# Patient Record
Sex: Female | Born: 1995
Health system: Southern US, Community
[De-identification: ages and names within clinical notes are randomized; demographics above are authoritative.]

## PROBLEM LIST (undated history)

## (undated) DIAGNOSIS — G709 Myoneural disorder, unspecified: Secondary | ICD-10-CM

## (undated) DIAGNOSIS — E669 Obesity, unspecified: Secondary | ICD-10-CM

## (undated) DIAGNOSIS — R7303 Prediabetes: Secondary | ICD-10-CM

## (undated) DIAGNOSIS — M6282 Rhabdomyolysis: Secondary | ICD-10-CM

## (undated) DIAGNOSIS — T7840XA Allergy, unspecified, initial encounter: Secondary | ICD-10-CM

## (undated) DIAGNOSIS — L309 Dermatitis, unspecified: Secondary | ICD-10-CM

## (undated) DIAGNOSIS — E282 Polycystic ovarian syndrome: Secondary | ICD-10-CM

## (undated) HISTORY — DX: Myoneural disorder, unspecified: G70.9

## (undated) HISTORY — PX: TONSILLECTOMY: SUR1361

## (undated) HISTORY — DX: Dermatitis, unspecified: L30.9

---
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
## 2017-01-07 MED FILL — NORETHINDRONE 0.35 MG TAB: 0.35 | 84 days supply | Qty: 84 | Fill #2

## 2017-01-08 MED FILL — metroNIDAZOLE 500 MG TABS: 500 | 7 days supply | Qty: 14 | Fill #0

## 2017-01-15 DIAGNOSIS — N7689 Other specified inflammation of vagina and vulva: Secondary | ICD-10-CM | POA: Diagnosis not present

## 2017-01-15 MED FILL — FLUCONAZOLE 150 MG TABLET: 150 | 3 days supply | Qty: 2 | Fill #0

## 2017-02-16 DIAGNOSIS — N771 Vaginitis, vulvitis and vulvovaginitis in diseases classified elsewhere: Secondary | ICD-10-CM | POA: Diagnosis not present

## 2017-02-16 DIAGNOSIS — J301 Allergic rhinitis due to pollen: Secondary | ICD-10-CM | POA: Diagnosis not present

## 2017-02-16 MED FILL — OFLOXACIN 0.3% EAR DROPS: 0.3 | 12 days supply | Qty: 5 | Fill #0

## 2017-03-08 ENCOUNTER — Other Ambulatory Visit (HOSPITAL_COMMUNITY)
Admission: RE | Admit: 2017-03-08 | Discharge: 2017-03-08 | Disposition: A | Discharge status: Home / Self Care | Payer: 59 | Source: Ambulatory Visit | Attending: Family Medicine | Admitting: Family Medicine

## 2017-03-08 ENCOUNTER — Other Ambulatory Visit: Payer: Self-pay | Admitting: Family Medicine

## 2017-03-08 DIAGNOSIS — N760 Acute vaginitis: Secondary | ICD-10-CM | POA: Insufficient documentation

## 2017-03-08 DIAGNOSIS — Z113 Encounter for screening for infections with a predominantly sexual mode of transmission: Secondary | ICD-10-CM | POA: Diagnosis not present

## 2017-03-08 DIAGNOSIS — N771 Vaginitis, vulvitis and vulvovaginitis in diseases classified elsewhere: Secondary | ICD-10-CM | POA: Diagnosis not present

## 2017-03-11 LAB — URINE CYTOLOGY ANCILLARY ONLY
Bacterial vaginitis: NEGATIVE
CHLAMYDIA, DNA PROBE: NEGATIVE
Candida vaginitis: NEGATIVE
Neisseria Gonorrhea: NEGATIVE
Trichomonas: NEGATIVE

## 2017-03-21 ENCOUNTER — Encounter (HOSPITAL_COMMUNITY): Payer: Self-pay | Admitting: *Deleted

## 2017-03-21 ENCOUNTER — Other Ambulatory Visit: Payer: Self-pay

## 2017-03-21 ENCOUNTER — Observation Stay (HOSPITAL_COMMUNITY)
Admission: EM | Admit: 2017-03-21 | Discharge: 2017-03-22 | Disposition: A | Discharge status: Home / Self Care | Payer: 59 | Attending: Internal Medicine | Admitting: Internal Medicine

## 2017-03-21 DIAGNOSIS — Z8249 Family history of ischemic heart disease and other diseases of the circulatory system: Secondary | ICD-10-CM | POA: Diagnosis not present

## 2017-03-21 DIAGNOSIS — E282 Polycystic ovarian syndrome: Secondary | ICD-10-CM | POA: Diagnosis not present

## 2017-03-21 DIAGNOSIS — Z833 Family history of diabetes mellitus: Secondary | ICD-10-CM | POA: Diagnosis not present

## 2017-03-21 DIAGNOSIS — Z9109 Other allergy status, other than to drugs and biological substances: Secondary | ICD-10-CM | POA: Diagnosis not present

## 2017-03-21 DIAGNOSIS — Z68.41 Body mass index (BMI) pediatric, greater than or equal to 95th percentile for age: Secondary | ICD-10-CM | POA: Diagnosis not present

## 2017-03-21 DIAGNOSIS — Z818 Family history of other mental and behavioral disorders: Secondary | ICD-10-CM | POA: Insufficient documentation

## 2017-03-21 DIAGNOSIS — R200 Anesthesia of skin: Secondary | ICD-10-CM | POA: Diagnosis not present

## 2017-03-21 DIAGNOSIS — R748 Abnormal levels of other serum enzymes: Secondary | ICD-10-CM | POA: Insufficient documentation

## 2017-03-21 DIAGNOSIS — E876 Hypokalemia: Secondary | ICD-10-CM | POA: Insufficient documentation

## 2017-03-21 DIAGNOSIS — M6282 Rhabdomyolysis: Secondary | ICD-10-CM | POA: Insufficient documentation

## 2017-03-21 DIAGNOSIS — Z79899 Other long term (current) drug therapy: Secondary | ICD-10-CM | POA: Diagnosis not present

## 2017-03-21 DIAGNOSIS — K219 Gastro-esophageal reflux disease without esophagitis: Secondary | ICD-10-CM | POA: Diagnosis not present

## 2017-03-21 DIAGNOSIS — Z91018 Allergy to other foods: Secondary | ICD-10-CM | POA: Insufficient documentation

## 2017-03-21 DIAGNOSIS — Z91013 Allergy to seafood: Secondary | ICD-10-CM | POA: Diagnosis not present

## 2017-03-21 DIAGNOSIS — Z8632 Personal history of gestational diabetes: Secondary | ICD-10-CM | POA: Insufficient documentation

## 2017-03-21 DIAGNOSIS — M791 Myalgia, unspecified site: Secondary | ICD-10-CM | POA: Diagnosis present

## 2017-03-21 LAB — RAPID URINE DRUG SCREEN, HOSP PERFORMED
AMPHETAMINES: NOT DETECTED
BENZODIAZEPINES: NOT DETECTED
Barbiturates: NOT DETECTED
COCAINE: NOT DETECTED
OPIATES: NOT DETECTED
TETRAHYDROCANNABINOL: NOT DETECTED

## 2017-03-21 LAB — CBC WITH DIFFERENTIAL/PLATELET
Basophils Absolute: 0 10*3/uL (ref 0.0–0.1)
Basophils Relative: 0 %
EOS ABS: 0.2 10*3/uL (ref 0.0–0.7)
EOS PCT: 2 %
HCT: 38.8 % (ref 36.0–46.0)
Hemoglobin: 12.8 g/dL (ref 12.0–15.0)
LYMPHS ABS: 3.7 10*3/uL (ref 0.7–4.0)
Lymphocytes Relative: 42 %
MCH: 28.1 pg (ref 26.0–34.0)
MCHC: 33 g/dL (ref 30.0–36.0)
MCV: 85.3 fL (ref 78.0–100.0)
MONOS PCT: 8 %
Monocytes Absolute: 0.7 10*3/uL (ref 0.1–1.0)
Neutro Abs: 4.2 10*3/uL (ref 1.7–7.7)
Neutrophils Relative %: 48 %
PLATELETS: 265 10*3/uL (ref 150–400)
RBC: 4.55 MIL/uL (ref 3.87–5.11)
RDW: 12.5 % (ref 11.5–15.5)
WBC: 8.7 10*3/uL (ref 4.0–10.5)

## 2017-03-21 LAB — COMPREHENSIVE METABOLIC PANEL
ALT: 19 U/L (ref 14–54)
AST: 21 U/L (ref 15–41)
Albumin: 3.6 g/dL (ref 3.5–5.0)
Alkaline Phosphatase: 95 U/L (ref 38–126)
Anion gap: 6 (ref 5–15)
BILIRUBIN TOTAL: 0.4 mg/dL (ref 0.3–1.2)
BUN: 8 mg/dL (ref 6–20)
CHLORIDE: 107 mmol/L (ref 101–111)
CO2: 23 mmol/L (ref 22–32)
Calcium: 8.9 mg/dL (ref 8.9–10.3)
Creatinine, Ser: 0.8 mg/dL (ref 0.44–1.00)
Glucose, Bld: 95 mg/dL (ref 65–99)
POTASSIUM: 3.8 mmol/L (ref 3.5–5.1)
Sodium: 136 mmol/L (ref 135–145)
TOTAL PROTEIN: 7 g/dL (ref 6.5–8.1)

## 2017-03-21 LAB — URINALYSIS, ROUTINE W REFLEX MICROSCOPIC
Bilirubin Urine: NEGATIVE
GLUCOSE, UA: NEGATIVE mg/dL
Hgb urine dipstick: NEGATIVE
Ketones, ur: NEGATIVE mg/dL
Nitrite: NEGATIVE
PH: 5 (ref 5.0–8.0)
PROTEIN: NEGATIVE mg/dL
Specific Gravity, Urine: 1.026 (ref 1.005–1.030)

## 2017-03-21 LAB — TSH: TSH: 4.376 u[IU]/mL (ref 0.350–4.500)

## 2017-03-21 LAB — PREGNANCY, URINE: PREG TEST UR: NEGATIVE

## 2017-03-21 LAB — CK
CK TOTAL: 277 U/L — AB (ref 38–234)
Total CK: 249 U/L — ABNORMAL HIGH (ref 38–234)

## 2017-03-21 LAB — SEDIMENTATION RATE: Sed Rate: 24 mm/hr — ABNORMAL HIGH (ref 0–22)

## 2017-03-21 MED ORDER — EPINEPHRINE 0.3 MG/0.3ML IJ SOAJ
0.3000 mg | Freq: Every day | INTRAMUSCULAR | Status: DC | PRN
  Filled 2017-03-21: qty 0.3

## 2017-03-21 MED ORDER — NORETHINDRONE 0.35 MG PO TABS
1.0000 | ORAL_TABLET | Freq: Every day | ORAL | Status: DC
  Administered 2017-03-22: 0.35 mg via ORAL

## 2017-03-21 MED ORDER — BISACODYL 10 MG RE SUPP: 10.0000 mg | Freq: Every day | RECTAL | Status: DC | PRN

## 2017-03-21 MED ORDER — SODIUM CHLORIDE 0.9 % IV BOLUS (SEPSIS)
1000.0000 mL | Freq: Once | INTRAVENOUS | Status: AC
  Administered 2017-03-21: 1000 mL via INTRAVENOUS

## 2017-03-21 MED ORDER — ENOXAPARIN SODIUM 40 MG/0.4ML ~~LOC~~ SOLN
40.0000 mg | SUBCUTANEOUS | Status: DC
  Administered 2017-03-21 – 2017-03-22 (×2): 40 mg via SUBCUTANEOUS
  Filled 2017-03-21 (×2): qty 0.4

## 2017-03-21 MED ORDER — SUCRALFATE 1 GM/10ML PO SUSP
1.0000 g | Freq: Three times a day (TID) | ORAL | Status: DC
  Administered 2017-03-21 – 2017-03-22 (×5): 1 g via ORAL
  Filled 2017-03-21 (×5): qty 10

## 2017-03-21 MED ORDER — ACETAMINOPHEN 650 MG RE SUPP: 650.0000 mg | Freq: Four times a day (QID) | RECTAL | Status: DC | PRN

## 2017-03-21 MED ORDER — KETOROLAC TROMETHAMINE 15 MG/ML IJ SOLN: 15.0000 mg | Freq: Four times a day (QID) | INTRAMUSCULAR | Status: DC | PRN

## 2017-03-21 MED ORDER — LORATADINE 10 MG PO TABS
10.0000 mg | ORAL_TABLET | Freq: Every day | ORAL | Status: DC
  Administered 2017-03-21 – 2017-03-22 (×2): 10 mg via ORAL
  Filled 2017-03-21 (×2): qty 1

## 2017-03-21 MED ORDER — SODIUM CHLORIDE 0.9 % IV SOLN
INTRAVENOUS | Status: AC
  Administered 2017-03-21 – 2017-03-22 (×3): via INTRAVENOUS

## 2017-03-21 MED ORDER — ACETAMINOPHEN 325 MG PO TABS: 650.0000 mg | ORAL_TABLET | Freq: Four times a day (QID) | ORAL | Status: DC | PRN

## 2017-03-21 MED ORDER — TRAZODONE HCL 50 MG PO TABS: 25.0000 mg | ORAL_TABLET | Freq: Every evening | ORAL | Status: DC | PRN

## 2017-03-21 MED ORDER — ONDANSETRON HCL 4 MG PO TABS: 4.0000 mg | ORAL_TABLET | Freq: Four times a day (QID) | ORAL | Status: DC | PRN

## 2017-03-21 MED ORDER — ONDANSETRON HCL 4 MG/2ML IJ SOLN: 4.0000 mg | Freq: Four times a day (QID) | INTRAMUSCULAR | Status: DC | PRN

## 2017-03-21 MED ORDER — TRAMADOL HCL 50 MG PO TABS: 50.0000 mg | ORAL_TABLET | Freq: Four times a day (QID) | ORAL | Status: DC | PRN

## 2017-03-21 NOTE — ED Triage Notes (Signed)
PT states she woke up this am with dark colored urine and some muscle cramping/tingling in her legs.  States hx of rhabdomyolysis with only s/s being dark urine and muscle cramping.  Denies in jury.

## 2017-03-21 NOTE — ED Notes (Signed)
Provider at the bedside.  

## 2017-03-21 NOTE — ED Notes (Signed)
ED Provider at bedside. 

## 2017-03-21 NOTE — ED Provider Notes (Signed)
[facility] EMERGENCY DEPARTMENT Provider Note   CSN: [id number] Arrival date & time: 03/21/17  0701     History   Chief Complaint Chief Complaint  Patient presents with  . Hematuria    HPI Kim Farrell is a 21 y.o. female presents with chief complaint of darkened urine that she first noted this morning.  Patient reports that she had one episode during which her urine was coca cola colored.  She also reports that this morning she noted some tingling and a tightness sensation in the bilateral anterior/medial thigh region.  Reports this is worse with ambulating, better with resting.  States that yesterday was a normal day, denies any change in activity or injury over the past week.  Reports that she has a history of rhabdomyolysis without specific event/cause, states this feels very similar- records reviewed occurred 05/2015 and 03/2016.  Denies fever, chills, dysuria, abdominal pain, N/V/D, chest pain, or shortness of breath. No recent URI/GI illnesses.  HPI  Past Medical History:  Diagnosis Date  . Allergy    pollen, scallops, cantelope,   . Eczema   . Obesity   . PCOS (polycystic ovarian syndrome) 2017   pt self reported as reason for being on metformin  . Pre-diabetes   . Rhabdomyolysis   . Rhabdomyolysis 03/2016    Patient Active Problem List   Diagnosis Date Noted  . Hypokalemia 04/09/2016  . Diabetes mellitus during pregnancy, antepartum 04/09/2016  . GERD (gastroesophageal reflux disease) 04/09/2016  . Morbid obesity (HCC) 04/09/2016  . H/O multiple allergies 04/09/2016  . Prediabetes   . Non-traumatic rhabdomyolysis   . 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 Elevated CK   . Myalgia     Past Surgical History:  Procedure Laterality Date  . ADENOIDECTOMY  2015  . MUSCLE BIOPSY Right 04/14/2016   Procedure: MUSCLE BIOPSY;  Surgeon: Berna Bue, MD;  Location: [facility] OR;  Service: General;  Laterality: Right;  . TONSILLECTOMY      OB  History    No data available       Home Medications    Prior to Admission medications   Medication Sig Start Date End Date Taking? Authorizing Provider  cetirizine (ZYRTEC) 10 MG tablet Take 10 mg by mouth daily. Reported on 09/05/2015    [provider]  EPINEPHrine 0.3 mg/0.3 mL IJ SOAJ injection Inject 0.3 mLs (0.3 mg total) into the muscle once. Patient taking differently: Inject 0.3 mg into the muscle daily as needed (allergic reactions).  03/06/15   Linna Hoff, MD  norethindrone (MICRONOR,CAMILA,ERRIN) 0.35 MG tablet Take 1 tablet by mouth daily.    [provider]  sucralfate (CARAFATE) 1 GM/10ML suspension Take 10 mLs (1 g total) by mouth 4 (four) times daily -  with meals and at bedtime. 04/04/16   Pisciotta, Joni Reining, PA-C    Family History Family History  Problem Relation Age of Onset  . Allergic rhinitis Maternal Aunt   . Angioedema Mother        only lip for 1 year in 2011 with hives  . Hypertension Mother   . Hyperlipidemia Mother   . Schizophrenia Maternal Uncle   . Diabetes Maternal Grandmother   . Hypertension Maternal Grandmother   . Asthma Neg Hx   . Atopy Neg Hx   . Eczema Neg Hx   . Immunodeficiency Neg Hx   . Urticaria Neg Hx   . Thyroid disease Neg Hx     Social History Social History  Tobacco Use  . Smoking status: Never Smoker  . Smokeless tobacco: Never Used  Substance Use Topics  . Alcohol use: No  . Drug use: No     Allergies   Cantaloupe (diagnostic); Other; and Scallops [shellfish allergy]   Review of Systems Review of Systems  Constitutional: Negative for appetite change, chills and fever.  HENT: Negative for congestion and rhinorrhea.   Eyes: Negative for visual disturbance.  Respiratory: Negative for cough and shortness of breath.   Cardiovascular: Negative for chest pain and leg swelling.  Gastrointestinal: Negative for abdominal pain, constipation, diarrhea, nausea and vomiting.  Genitourinary: Positive  for hematuria. Negative for dysuria, vaginal bleeding and vaginal discharge.  Musculoskeletal: Positive for myalgias (bilateral lower extremities- upper leg). Negative for back pain.  Neurological: Negative for dizziness, light-headedness and headaches.       Paresthesias to bilateral lower extremities- upper leg area  All other systems reviewed and are negative.    Physical Exam Updated Vital Signs BP 132/78 (BP Location: Right Arm)   Pulse 72   Temp 99.1 F (37.3 C) (Oral)   Resp 18   Ht 5\' 2"  (1.575 m)   Wt 96.6 kg (213 lb)   SpO2 100%   BMI 38.96 kg/m   Physical Exam  Constitutional: She appears well-developed and well-nourished. No distress.  HENT:  Head: Normocephalic and atraumatic.  Eyes: Conjunctivae are normal. Right eye exhibits no discharge. Left eye exhibits no discharge.  Neck: Neck supple.  Cardiovascular: Normal rate and regular rhythm.  No murmur heard. Pulmonary/Chest: Breath sounds normal. No respiratory distress. She has no wheezes. She has no rales.  Abdominal: Soft. She exhibits no distension. There is no tenderness. There is no CVA tenderness.  Musculoskeletal:  Lower extremity muscle tenderness to anterior thighs. No specific bony tenderness.   Neurological: She is alert.  Clear speech.  5 out of 5 strength with grip strength, plantar flexion, dorsiflexion, and hip flexion bilaterally.  Sensation intact to sharp and dull to bilateral upper and lower extremities   Skin: Skin is warm and dry. No rash noted.  Psychiatric: She has a normal mood and affect. Her behavior is normal.  Nursing note and vitals reviewed.    ED Treatments / Results  Labs (all labs ordered are listed, but only abnormal results are displayed) Labs Reviewed  URINALYSIS, ROUTINE W REFLEX MICROSCOPIC - Abnormal; Notable for the following components:      Result Value   Leukocytes, UA SMALL (*)    Bacteria, UA RARE (*)    Squamous Epithelial / LPF 0-5 (*)    All other  components within normal limits  CK - Abnormal; Notable for the following components:   Total CK 277 (*)    All other components within normal limits  URINE CULTURE  CBC WITH DIFFERENTIAL/PLATELET  COMPREHENSIVE METABOLIC PANEL  PREGNANCY, URINE  RAPID URINE DRUG SCREEN, HOSP PERFORMED  TSH  CK  SEDIMENTATION RATE  CMV IGM  EPSTEIN-BARR VIRUS VCA, IGG  EPSTEIN-BARR VIRUS VCA, IGM    Procedures Procedures (including critical care time)  Medications Ordered in ED Medications  sodium chloride 0.9 % bolus 1,000 mL (1,000 mLs Intravenous New Bag/Given 03/21/17 0809)    Initial Impression / Assessment and Plan / ED Course  I have reviewed the triage vital signs and the nursing notes.  Pertinent labs & imaging results that were available during my care of the patient were reviewed by me and considered in my medical decision making (see chart for details).  Patient presents with complaint of darkened urine and myalgias.  She is nontoxic appearing with stable vitals.  Screening CBC and CMP within normal limits.  Urinalysis reveals no nitrites, rare bacteria, small amount of leukocytes therefore doubt cystitis or pyelonephritis. No RBC casts, proteinuria, HTN, or peripheral edema therefore doubt acute glomerulonephritis.  Patient's CK is mildly elevated at 277 with normal renal function. She has hx of rhabdomyolysis requiring admission twice previously- with these admissions patient has waited a few days prior to presenting to the ED for evaluation. Given this is her first day of sxs in combination with prior admissions anticipate there is potential for worsening. Will admit for observation with IVFs and repeat CK.   Patient evaluated and managed with supervising physician Dr. Verdie MosherLiu- Dr. Verdie MosherLiu discussed patient's case with hospitalist who has accepted admission.     Final Clinical Impressions(s) / ED Diagnoses   Final diagnoses:  Elevated CK    ED Discharge Orders    None         Cherly Andersonetrucelli, Deerica Waszak R, PA-C 03/21/17 1301    Lavera GuiseLiu, Dana Duo, MD 03/22/17 [phone]

## 2017-03-21 NOTE — ED Provider Notes (Signed)
Medical screening examination/treatment/procedure(s) were conducted as a shared visit with non-physician practitioner(s) and myself.  I personally evaluated the patient during the encounter.   EKG Interpretation None      21 year old female who presents with dark colored urine. History of recurrent viral myositis, admitted in 03/2016 and 05/2015 for rhabdomyolysis. This morning woke up with discolored urine and muscle cramping and aching in legs. No recent URI or GI symptoms. No fever. No exertional activity or trauma.  Well appearing. Stable vitals. Tenderness to palpation of bilateral LE muscularly. CK only 200s today with normal renal function. Typically symptoms last 1-2 weeks per family, and she normally has waited several days before presentation for evaluation. Just started having symptoms this morning. Anticipate worsening of her CK and potential worsening rhabdomyolysis. Plan on admission for observation for IVF and repeat CK in AM.    Lavera GuiseLiu, Kim Godwin Duo, MD 03/21/17 1053

## 2017-03-21 NOTE — H&P (Signed)
History and Physical    Kim Farrell OIL:[id number] DOB: Dec 25, 1995 DOA: 03/21/2017  PCP: Lucianne Lei, MD Patient coming from: home  Chief Complaint: bilateral LE pain/numbness  HPI: Kim Farrell is a 21 y.o. female with medical history significant for recurrent viral myositis, rhabdomyolitis, GERD, morbid obesity, environmental/food allergies, eczema and prediabetes presents to the emergency department with the chief complaint of dark colored urine and bilateral lower extremity numbness tingling and pain. Initial evaluation reveals an elevated CK. Right hospitalists are asked to admit.  Information is obtained from the chart the patient and her mother who is at the bedside. Patient states she was in her usual state of health until this morning she awakened and had very dark colored urine. Associated symptoms include a numbness tingling bilateral lower extremities in the thigh area. She states that she ambulated she developed a "tight" feeling in her legs as well as some swelling. She reports both subsided when she rested. Patient has a history of recurrent viral myositis and mother describes last year when patient admitted with the same she had a cold/cough before symptoms came on. She denies any cough cold fever chills dysuria hematuria frequency or urgency. She denies headache dizziness syncope or near-syncope. No chest pain palpitation shortness of breath. No abdominal pain nausea vomiting diarrhea constipation melena or bright red blood per rectum. Patient denies any recent trauma or fall increase in physical activity. Mother does report that patient also has a history of multiple allergies and has had episodes of angioedema but that this year that condition has improved to the point that she no longer has to take medications for same.    ED Course: In the emergency department she's afebrile hemodynamically stable and not hypoxic. She is provided with 1 L normal  saline.  Review of Systems: As per HPI otherwise all other systems reviewed and are negative.   Ambulatory Status: Ambulates independently. Lives at home with her mother. She is a high Retail buyer  Past Medical History:  Diagnosis Date  . Allergy    pollen, scallops, cantelope,   . Eczema   . Obesity   . PCOS (polycystic ovarian syndrome) 2017   pt self reported as reason for being on metformin  . Pre-diabetes   . Rhabdomyolysis   . Rhabdomyolysis 03/2016    Past Surgical History:  Procedure Laterality Date  . ADENOIDECTOMY  2015  . MUSCLE BIOPSY Right 04/14/2016   Procedure: MUSCLE BIOPSY;  Surgeon: Clovis Riley, MD;  Location: [facility];  Service: General;  Laterality: Right;  . TONSILLECTOMY      Social History   Socioeconomic History  . Marital status: Single    Spouse name: Not on file  . Number of children: Not on file  . Years of education: Not on file  . Highest education level: Not on file  Social Needs  . Financial resource strain: Not on file  . Food insecurity - worry: Not on file  . Food insecurity - inability: Not on file  . Transportation needs - medical: Not on file  . Transportation needs - non-medical: Not on file  Occupational History  . Not on file  Tobacco Use  . Smoking status: Never Smoker  . Smokeless tobacco: Never Used  Substance and Sexual Activity  . Alcohol use: No  . Drug use: No  . Sexual activity: No    Birth control/protection: Abstinence  Other Topics Concern  . Not on file  Social History Narrative  Pt lives at home with mom, grandmother, cousin, and maternal uncle. No pets.     Allergies  Allergen Reactions  . Cantaloupe (Diagnostic) Swelling    lips  . Other     Cardic DM (a cough medicine) turns red canalope facial swelling  . Scallops [Shellfish Allergy] Swelling    Lip swelling    Family History  Problem Relation Age of Onset  . Allergic rhinitis Maternal Aunt   . Angioedema Mother        only lip for  1 year in 2011 with hives  . Hypertension Mother   . Hyperlipidemia Mother   . Schizophrenia Maternal Uncle   . Diabetes Maternal Grandmother   . Hypertension Maternal Grandmother   . Asthma Neg Hx   . Atopy Neg Hx   . Eczema Neg Hx   . Immunodeficiency Neg Hx   . Urticaria Neg Hx   . Thyroid disease Neg Hx     Prior to Admission medications   Medication Sig Start Date End Date Taking? Authorizing Provider  cetirizine (ZYRTEC) 10 MG tablet Take 10 mg by mouth daily. Reported on 09/05/2015    [provider]  EPINEPHrine 0.3 mg/0.3 mL IJ SOAJ injection Inject 0.3 mLs (0.3 mg total) into the muscle once. Patient taking differently: Inject 0.3 mg into the muscle daily as needed (allergic reactions).  03/06/15   Billy Fischer, MD  norethindrone (MICRONOR,CAMILA,ERRIN) 0.35 MG tablet Take 1 tablet by mouth daily.    [provider]  sucralfate (CARAFATE) 1 GM/10ML suspension Take 10 mLs (1 g total) by mouth 4 (four) times daily -  with meals and at bedtime. 04/04/16   Pisciotta, Elmyra Ricks, PA-C    Physical Exam: Vitals:   03/21/17 0945 03/21/17 1000 03/21/17 1015 03/21/17 1030  BP: 104/62 114/61 101/85 (!) 108/58  Pulse: 76 76 80 75  Resp:      Temp:      TempSrc:      SpO2: 98% 96% 100% 100%  Weight:      Height:         General:  Appears calm and comfortable, obese no acute distress Eyes:  PERRL, EOMI, normal lids, iris ENT:  grossly normal hearing, lips & tongue, mucous membranes of her mouth are moist and pink Neck:  no LAD, masses or thyromegaly Cardiovascular:  RRR, no m/r/g. No LE edema. Lower extremities nontender to palpation Respiratory:  CTA bilaterally, no w/r/r. Normal respiratory effort. Abdomen:  soft, ntnd, obese positive bowel sounds no guarding or rebounding Skin:  no rash or induration seen on limited exam Musculoskeletal:  grossly normal tone BUE/BLE, good ROM, no bony abnormality Psychiatric:  grossly normal mood and affect, speech fluent and  appropriate, AOx3 Neurologic:  CN 2-12 grossly intact, moves all extremities in coordinated fashion, sensation intact  Labs on Admission: I have personally reviewed following labs and imaging studies  CBC: Recent Labs  Lab 03/21/17 0802  WBC 8.7  NEUTROABS 4.2  HGB 12.8  HCT 38.8  MCV 85.3  PLT 801   Basic Metabolic Panel: Recent Labs  Lab 03/21/17 0802  NA 136  K 3.8  CL 107  CO2 23  GLUCOSE 95  BUN 8  CREATININE 0.80  CALCIUM 8.9   GFR: Estimated Creatinine Clearance: 122.7 mL/min (by C-G formula based on SCr of 0.8 mg/dL). Liver Function Tests: Recent Labs  Lab 03/21/17 0802  AST 21  ALT 19  ALKPHOS 95  BILITOT 0.4  PROT 7.0  ALBUMIN 3.6  No results for input(s): LIPASE, AMYLASE in the last 168 hours. No results for input(s): AMMONIA in the last 168 hours. Coagulation Profile: No results for input(s): INR, PROTIME in the last 168 hours. Cardiac Enzymes: Recent Labs  Lab 03/21/17 0802  CKTOTAL 277*   BNP (last 3 results) No results for input(s): PROBNP in the last 8760 hours. HbA1C: No results for input(s): HGBA1C in the last 72 hours. CBG: No results for input(s): GLUCAP in the last 168 hours. Lipid Profile: No results for input(s): CHOL, HDL, LDLCALC, TRIG, CHOLHDL, LDLDIRECT in the last 72 hours. Thyroid Function Tests: No results for input(s): TSH, T4TOTAL, FREET4, T3FREE, THYROIDAB in the last 72 hours. Anemia Panel: No results for input(s): VITAMINB12, FOLATE, FERRITIN, TIBC, IRON, RETICCTPCT in the last 72 hours. Urine analysis:    Component Value Date/Time   COLORURINE YELLOW 03/21/2017 0802   APPEARANCEUR CLEAR 03/21/2017 0802   LABSPEC 1.026 03/21/2017 0802   PHURINE 5.0 03/21/2017 0802   GLUCOSEU NEGATIVE 03/21/2017 0802   HGBUR NEGATIVE 03/21/2017 0802   BILIRUBINUR NEGATIVE 03/21/2017 0802   KETONESUR NEGATIVE 03/21/2017 0802   PROTEINUR NEGATIVE 03/21/2017 0802   NITRITE NEGATIVE 03/21/2017 0802   LEUKOCYTESUR SMALL (A)  03/21/2017 0802    Creatinine Clearance: Estimated Creatinine Clearance: 122.7 mL/min (by C-G formula based on SCr of 0.8 mg/dL).  Sepsis Labs: '@LABRCNTIP' (procalcitonin:4,lacticidven:4) )No results found for this or any previous visit (from the past 240 hour(s)).   Radiological Exams on Admission: No results found.  EKG:   Assessment/Plan Active Problems:   Elevated CK   Myalgia   GERD (gastroesophageal reflux disease)   Morbid obesity (HCC)   Non-traumatic rhabdomyolysis   #1. Elevated CK/nontraumatic rhabdomyolysis. Patient with a history of same. 2 hospitalizations last year when patient was diagnosed with viral myositis and admitted with CK greater than 50,000. Today CK 277. Of note last years hospitalizations occurred after 2-3 day history of upper respiratory type symptoms.  Patient denies any such symptoms/illnesses prior to onset. 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 Patient had a muscle biopsy last year and chart review reveals no MHC I staining to suggest a systemic auto immune myositis. -Admit to medical floor -Vigorous IV fluids -Obtain HIV, CMV, ESR, EBV, ANA -frequent CK checks -tylenol, torodol  #2. GERD. Stable at baseline. -PPI  #3. Obesity. BMI 38.9 -Nutritional consult  #4. Myalgia. See above -torodol -monitor   DVT prophylaxis: lovenox Code Status: full  Family Communication: mother at bedside  Disposition Plan: home   Consults called: none  Admission status: obs    Dyanne Carrel M MD [facility]  If 7PM-7AM, please contact night-coverage [email or web address] Password [facility]  03/21/2017, 10:43 AM

## 2017-03-22 DIAGNOSIS — E876 Hypokalemia: Secondary | ICD-10-CM | POA: Diagnosis not present

## 2017-03-22 DIAGNOSIS — M6282 Rhabdomyolysis: Secondary | ICD-10-CM

## 2017-03-22 DIAGNOSIS — R200 Anesthesia of skin: Secondary | ICD-10-CM | POA: Diagnosis not present

## 2017-03-22 DIAGNOSIS — R748 Abnormal levels of other serum enzymes: Secondary | ICD-10-CM | POA: Diagnosis not present

## 2017-03-22 DIAGNOSIS — M791 Myalgia, unspecified site: Secondary | ICD-10-CM | POA: Diagnosis not present

## 2017-03-22 DIAGNOSIS — E282 Polycystic ovarian syndrome: Secondary | ICD-10-CM | POA: Diagnosis not present

## 2017-03-22 DIAGNOSIS — Z8632 Personal history of gestational diabetes: Secondary | ICD-10-CM | POA: Diagnosis not present

## 2017-03-22 DIAGNOSIS — K219 Gastro-esophageal reflux disease without esophagitis: Secondary | ICD-10-CM

## 2017-03-22 DIAGNOSIS — Z68.41 Body mass index (BMI) pediatric, greater than or equal to 95th percentile for age: Secondary | ICD-10-CM | POA: Diagnosis not present

## 2017-03-22 LAB — EPSTEIN-BARR VIRUS VCA, IGM: EBV VCA IgM: <36 U/mL (ref 0.0–35.9)

## 2017-03-22 LAB — URINE CULTURE: Culture: <10000 — AB

## 2017-03-22 LAB — EPSTEIN-BARR VIRUS VCA, IGG: EBV VCA IgG: 463 U/mL — ABNORMAL HIGH (ref 0.0–17.9)

## 2017-03-22 LAB — CMV IGM: CMV IgM: <30 AU/mL (ref 0.0–29.9)

## 2017-03-22 LAB — CK: Total CK: 202 U/L (ref 38–234)

## 2017-03-22 NOTE — Progress Notes (Signed)
Discharge instruction given to patient and her mother, no questions. PIV discontinue. D/C paperwork given to patient. Pt also given letter for school for excuse

## 2017-03-22 NOTE — Discharge Summary (Addendum)
Discharge Summary  Kim Farrell [id number] DOB: 08-24-1995  PCP: Kim Farrell, Veita, MD  Admit date: 03/21/2017 Discharge date: 03/22/2017  Time spent: 25 minutes   Recommendations for Outpatient Follow-up:  1. Follow up with PCP within a week 2. Take your medications as precribed  Discharge Diagnoses:  Active Hospital Problems   Diagnosis Date Noted  . Morbid obesity (HCC) 04/09/2016  . GERD (gastroesophageal reflux disease) 04/09/2016  . Non-traumatic rhabdomyolysis   . Elevated CK   . Myalgia     Resolved Hospital Problems  No resolved problems to display.    Discharge Condition:  Stable  Diet recommendation: Resume previous diet  Vitals:   03/21/17 1954 03/22/17 0543  BP: 122/69 (!) 114/49  Pulse: 69 61  Resp: 16 17  Temp: 98.7 F (37.1 C) 98 F (36.7 C)  SpO2: 100% 99%    History of present illness:  Kim Farrell is a 21 year old obese female with PMH significant for viral myositis, non traumatic rhabdomyolysis who presented with complaints of bilateral lower extremity numbness and tingling. Upon presentation to the ED CPK elevated at 277 with no recent trauma. CPK trended down and normalized. IV fluid hydration. No acute events overnight.  On the day of discharge, symptoms had resolved. Patient ambulated in her bedroom without any events. No complaints of dizziness or numbness. Patient encouraged to stay hydrated.  Hospital Course:  Active Problems:   Elevated CK   Myalgia   GERD (gastroesophageal reflux disease)   Morbid obesity (HCC)   Non-traumatic rhabdomyolysis  Elevated CPK non traumatic with unclear etiology -Previous episode of viral myositis in 2017 with CPK >50,000 -CPK 277 (03/21/17); IV hydration -CPK 202 (03/22/17) -continue po hydration  GERD -stable -carafate  Morbid obesity -Healthy diet and regular physical activity  Procedures:  None  Consultations:  None  Discharge Exam: BP (!) 114/49 (BP Location: Right Arm)    Pulse 61   Temp 98 F (36.7 C)   Resp 17   Ht 5\' 2"  (1.575 m)   Wt 117 kg (257 lb 15 oz)   SpO2 99%   BMI 47.18 kg/m   General: 21 yo AAF obese, laying in bed in NAD. A&O x 4 Cardiovascular: RRR with no murmurs rubs or gallops Respiratory: CTA with no wheezes or rhonchi noted  Discharge Instructions You were cared for by a hospitalist during your hospital stay. If you have any questions about your discharge medications or the care you received while you were in the hospital after you are discharged, you can call the unit and asked to speak with the hospitalist on call if the hospitalist that took care of you is not available. Once you are discharged, your primary care physician will handle any further medical issues. Please note that NO REFILLS for any discharge medications will be authorized once you are discharged, as it is imperative that you return to your primary care physician (or establish a relationship with a primary care physician if you do not have one) for your aftercare needs so that they can reassess your need for medications and monitor your lab values.   Allergies as of 03/22/2017      Reactions   Cantaloupe (diagnostic) Swelling   lips   Other    Cardic DM (a cough medicine) turns red canalope facial swelling   Scallops [shellfish Allergy] Swelling   Lip swelling      Medication List    TAKE these medications   EPINEPHrine 0.3 mg/0.3 mL  Soaj injection Commonly known as:  EPI-PEN Inject 0.3 mLs (0.3 mg total) into the muscle once. What changed:    when to take this  reasons to take this   norethindrone 0.35 MG tablet Commonly known as:  MICRONOR,CAMILA,ERRIN Take 1 tablet by mouth daily.   sucralfate 1 GM/10ML suspension Commonly known as:  CARAFATE Take 10 mLs (1 g total) by mouth 4 (four) times daily -  with meals and at bedtime.      Allergies  Allergen Reactions  . Cantaloupe (Diagnostic) Swelling    lips  . Other     Cardic DM (a cough  medicine) turns red canalope facial swelling  . Scallops [Shellfish Allergy] Swelling    Lip swelling   Follow-up Information    Kim Farrell, Veita, MD Follow up.   Specialty:  Family Medicine Contact information: [address] [place] [address] [phone]            The results of significant diagnostics from this hospitalization (including imaging, microbiology, ancillary and laboratory) are listed below for reference.    Significant Diagnostic Studies: No results found.  Microbiology: Recent Results (from the past 240 hour(s))  Urine culture     Status: Abnormal   Collection Time: 03/21/17  8:02 AM  Result Value Ref Range Status   Specimen Description URINE, CLEAN CATCH  Final   Special Requests NONE  Final   Culture <10,000 COLONIES/mL INSIGNIFICANT GROWTH (A)  Final   Report Status 03/22/2017 FINAL  Final     Labs: Basic Metabolic Panel: Recent Labs  Lab 03/21/17 0802  NA 136  K 3.8  CL 107  CO2 23  GLUCOSE 95  BUN 8  CREATININE 0.80  CALCIUM 8.9   Liver Function Tests: Recent Labs  Lab 03/21/17 0802  AST 21  ALT 19  ALKPHOS 95  BILITOT 0.4  PROT 7.0  ALBUMIN 3.6   No results for input(s): LIPASE, AMYLASE in the last 168 hours. No results for input(s): AMMONIA in the last 168 hours. CBC: Recent Labs  Lab 03/21/17 0802  WBC 8.7  NEUTROABS 4.2  HGB 12.8  HCT 38.8  MCV 85.3  PLT 265   Cardiac Enzymes: Recent Labs  Lab 03/21/17 0802 03/21/17 1504 03/22/17 0538  CKTOTAL 277* 249* 202   BNP: BNP (last 3 results) No results for input(s): BNP in the last 8760 hours.  ProBNP (last 3 results) No results for input(s): PROBNP in the last 8760 hours.  CBG: No results for input(s): GLUCAP in the last 168 hours.     Signed:  Darlin Droparole N Arias Weinert, MD [facility] 03/22/2017, 12:19 PM

## 2017-03-29 MED FILL — NORETHINDRONE 0.35 MG TAB: 0.35 | 84 days supply | Qty: 84 | Fill #3

## 2017-04-07 DIAGNOSIS — H40053 Ocular hypertension, bilateral: Secondary | ICD-10-CM | POA: Diagnosis not present

## 2017-04-07 DIAGNOSIS — H52222 Regular astigmatism, left eye: Secondary | ICD-10-CM | POA: Diagnosis not present

## 2017-04-28 DIAGNOSIS — N760 Acute vaginitis: Secondary | ICD-10-CM | POA: Diagnosis not present

## 2017-04-28 MED FILL — metroNIDAZOLE 500 MG TABS: 500 | 7 days supply | Qty: 14 | Fill #0

## 2017-05-25 DIAGNOSIS — H40053 Ocular hypertension, bilateral: Secondary | ICD-10-CM | POA: Diagnosis not present

## 2017-06-25 MED FILL — NORETHINDRONE 0.35 MG TAB: 0.35 | 84 days supply | Qty: 84 | Fill #4

## 2017-06-29 DIAGNOSIS — N760 Acute vaginitis: Secondary | ICD-10-CM | POA: Diagnosis not present

## 2017-06-29 MED FILL — CLINDAMYCIN HCL 300 MG CAPS: 300 | 7 days supply | Qty: 14 | Fill #0

## 2017-07-08 MED FILL — FLUCONAZOLE 150 MG TABLET: 150 | 1 days supply | Qty: 1 | Fill #0

## 2017-09-06 DIAGNOSIS — N760 Acute vaginitis: Secondary | ICD-10-CM | POA: Diagnosis not present

## 2017-09-06 DIAGNOSIS — N898 Other specified noninflammatory disorders of vagina: Secondary | ICD-10-CM | POA: Diagnosis not present

## 2017-09-06 MED FILL — metroNIDAZOLE 500 MG TABS: 500 | 7 days supply | Qty: 14 | Fill #0

## 2017-09-06 MED FILL — metroNIDAZOLE 0.75 % GEL: 0.75 | 21 days supply | Qty: 70 | Fill #0

## 2017-09-16 MED FILL — NORETHINDRONE 0.35 MG TAB: 0.35 | 28 days supply | Qty: 28 | Fill #5

## 2017-11-01 MED FILL — NORETHINDRONE 0.35 MG TAB: 0.35 | 84 days supply | Qty: 84 | Fill #0

## 2018-01-14 MED FILL — FLUCONAZOLE 150 MG TABS: 150 | 3 days supply | Qty: 2 | Fill #0

## 2018-01-14 MED FILL — metroNIDAZOLE 500 MG TABS: 500 | 7 days supply | Qty: 14 | Fill #0

## 2018-01-25 MED FILL — NORETHINDRONE 0.35 MG TAB: 0.35 | 84 days supply | Qty: 84 | Fill #1

## 2018-04-25 ENCOUNTER — Emergency Department (HOSPITAL_COMMUNITY): Payer: Self-pay

## 2018-04-25 ENCOUNTER — Other Ambulatory Visit: Payer: Self-pay

## 2018-04-25 ENCOUNTER — Encounter (HOSPITAL_COMMUNITY): Payer: Self-pay

## 2018-04-25 ENCOUNTER — Emergency Department (HOSPITAL_COMMUNITY)
Admission: EM | Admit: 2018-04-25 | Discharge: 2018-04-25 | Disposition: A | Discharge status: Home / Self Care | Payer: Self-pay | Attending: Emergency Medicine | Admitting: Emergency Medicine

## 2018-04-25 DIAGNOSIS — S0083XA Contusion of other part of head, initial encounter: Secondary | ICD-10-CM

## 2018-04-25 DIAGNOSIS — Y92009 Unspecified place in unspecified non-institutional (private) residence as the place of occurrence of the external cause: Secondary | ICD-10-CM | POA: Insufficient documentation

## 2018-04-25 DIAGNOSIS — S0990XA Unspecified injury of head, initial encounter: Secondary | ICD-10-CM | POA: Insufficient documentation

## 2018-04-25 DIAGNOSIS — Y999 Unspecified external cause status: Secondary | ICD-10-CM | POA: Insufficient documentation

## 2018-04-25 DIAGNOSIS — Y9389 Activity, other specified: Secondary | ICD-10-CM | POA: Insufficient documentation

## 2018-04-25 DIAGNOSIS — S0012XA Contusion of left eyelid and periocular area, initial encounter: Secondary | ICD-10-CM | POA: Insufficient documentation

## 2018-04-25 DIAGNOSIS — Z79899 Other long term (current) drug therapy: Secondary | ICD-10-CM | POA: Insufficient documentation

## 2018-04-25 MED ORDER — IBUPROFEN 800 MG PO TABS: 800.0000 mg | ORAL_TABLET | Freq: Three times a day (TID) | ORAL | 0 refills | Status: DC | PRN

## 2018-04-25 NOTE — Discharge Instructions (Signed)
Return here as needed. Follow up with a primary doctor. °
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
## 2018-04-25 NOTE — ED Notes (Signed)
Pt and family understood dc material. NAD noted. Script given at dc 
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
## 2018-05-20 MED FILL — NORLYDA 0.35 MG TABS: 0.35 | 84 days supply | Qty: 84 | Fill #2

## 2018-08-30 MED FILL — NORETHINDRONE 0.35 MG TAB: 0.35 | 84 days supply | Qty: 84 | Fill #0

## 2019-01-06 MED FILL — metroNIDAZOLE 500 MG TABS: 500 | 7 days supply | Qty: 14 | Fill #0

## 2019-10-15 ENCOUNTER — Encounter (HOSPITAL_COMMUNITY): Payer: Self-pay | Admitting: Emergency Medicine

## 2019-10-15 ENCOUNTER — Inpatient Hospital Stay (HOSPITAL_COMMUNITY)
Admission: EM | Admit: 2019-10-15 | Discharge: 2019-10-22 | DRG: 558 | Disposition: A | Discharge status: Home / Self Care | Payer: No Typology Code available for payment source | Attending: Internal Medicine | Admitting: Internal Medicine

## 2019-10-15 ENCOUNTER — Other Ambulatory Visit: Payer: Self-pay

## 2019-10-15 DIAGNOSIS — R7989 Other specified abnormal findings of blood chemistry: Secondary | ICD-10-CM

## 2019-10-15 DIAGNOSIS — K219 Gastro-esophageal reflux disease without esophagitis: Secondary | ICD-10-CM | POA: Diagnosis present

## 2019-10-15 DIAGNOSIS — Z6841 Body Mass Index (BMI) 40.0 and over, adult: Secondary | ICD-10-CM | POA: Diagnosis not present

## 2019-10-15 DIAGNOSIS — Z20822 Contact with and (suspected) exposure to covid-19: Secondary | ICD-10-CM | POA: Diagnosis present

## 2019-10-15 DIAGNOSIS — K760 Fatty (change of) liver, not elsewhere classified: Secondary | ICD-10-CM | POA: Diagnosis present

## 2019-10-15 DIAGNOSIS — Z91013 Allergy to seafood: Secondary | ICD-10-CM | POA: Diagnosis not present

## 2019-10-15 DIAGNOSIS — R7401 Elevation of levels of liver transaminase levels: Secondary | ICD-10-CM | POA: Diagnosis not present

## 2019-10-15 DIAGNOSIS — M6282 Rhabdomyolysis: Secondary | ICD-10-CM | POA: Diagnosis present

## 2019-10-15 LAB — CBC
HCT: 44.2 % (ref 36.0–46.0)
Hemoglobin: 14.3 g/dL (ref 12.0–15.0)
MCH: 28 pg (ref 26.0–34.0)
MCHC: 32.4 g/dL (ref 30.0–36.0)
MCV: 86.5 fL (ref 80.0–100.0)
Platelets: 222 10*3/uL (ref 150–400)
RBC: 5.11 MIL/uL (ref 3.87–5.11)
RDW: 12 % (ref 11.5–15.5)
WBC: 4.3 10*3/uL (ref 4.0–10.5)
nRBC: 0 % (ref 0.0–0.2)

## 2019-10-15 LAB — COMPREHENSIVE METABOLIC PANEL
ALT: 74 U/L — ABNORMAL HIGH (ref 0–44)
AST: 158 U/L — ABNORMAL HIGH (ref 15–41)
Albumin: 3.7 g/dL (ref 3.5–5.0)
Alkaline Phosphatase: 73 U/L (ref 38–126)
Anion gap: 9 (ref 5–15)
BUN: 9 mg/dL (ref 6–20)
CO2: 25 mmol/L (ref 22–32)
Calcium: 9 mg/dL (ref 8.9–10.3)
Chloride: 106 mmol/L (ref 98–111)
Creatinine, Ser: 0.62 mg/dL (ref 0.44–1.00)
GFR calc Af Amer: >60 mL/min (ref >60)
GFR calc non Af Amer: >60 mL/min (ref >60)
Glucose, Bld: 97 mg/dL (ref 70–99)
Potassium: 3.6 mmol/L (ref 3.5–5.1)
Sodium: 140 mmol/L (ref 135–145)
Total Bilirubin: 0.6 mg/dL (ref 0.3–1.2)
Total Protein: 7.7 g/dL (ref 6.5–8.1)

## 2019-10-15 LAB — URINALYSIS, ROUTINE W REFLEX MICROSCOPIC
Bilirubin Urine: NEGATIVE
Glucose, UA: NEGATIVE mg/dL
Hgb urine dipstick: NEGATIVE
Ketones, ur: NEGATIVE mg/dL
Leukocytes,Ua: NEGATIVE
Nitrite: NEGATIVE
Protein, ur: NEGATIVE mg/dL
Specific Gravity, Urine: 1.023 (ref 1.005–1.030)
pH: 6 (ref 5.0–8.0)

## 2019-10-15 LAB — I-STAT BETA HCG BLOOD, ED (MC, WL, AP ONLY): I-stat hCG, quantitative: <5 m[IU]/mL (ref <5)

## 2019-10-15 LAB — SARS CORONAVIRUS 2 BY RT PCR (HOSPITAL ORDER, PERFORMED IN ~~LOC~~ HOSPITAL LAB): SARS Coronavirus 2: NEGATIVE

## 2019-10-15 LAB — CK: Total CK: 39805 U/L — ABNORMAL HIGH (ref 38–234)

## 2019-10-15 MED ORDER — HYDROCODONE-ACETAMINOPHEN 5-325 MG PO TABS
1.0000 | ORAL_TABLET | ORAL | Status: DC | PRN
  Administered 2019-10-16 (×2): 2 via ORAL
  Filled 2019-10-15 (×2): qty 2

## 2019-10-15 MED ORDER — ACETAMINOPHEN 325 MG PO TABS: 650.0000 mg | ORAL_TABLET | Freq: Four times a day (QID) | ORAL | Status: DC | PRN

## 2019-10-15 MED ORDER — HYDROMORPHONE HCL 1 MG/ML IJ SOLN: 1.0000 mg | INTRAMUSCULAR | Status: DC | PRN

## 2019-10-15 MED ORDER — SENNOSIDES-DOCUSATE SODIUM 8.6-50 MG PO TABS: 1.0000 | ORAL_TABLET | Freq: Every evening | ORAL | Status: DC | PRN

## 2019-10-15 MED ORDER — ONDANSETRON HCL 4 MG/2ML IJ SOLN: 4.0000 mg | Freq: Four times a day (QID) | INTRAMUSCULAR | Status: DC | PRN

## 2019-10-15 MED ORDER — ONDANSETRON HCL 4 MG PO TABS: 4.0000 mg | ORAL_TABLET | Freq: Four times a day (QID) | ORAL | Status: DC | PRN

## 2019-10-15 MED ORDER — ACETAMINOPHEN 650 MG RE SUPP
650.0000 mg | Freq: Four times a day (QID) | RECTAL | Status: DC | PRN
  Filled 2019-10-15: qty 1

## 2019-10-15 MED ORDER — ENOXAPARIN SODIUM 40 MG/0.4ML ~~LOC~~ SOLN
40.0000 mg | Freq: Every day | SUBCUTANEOUS | Status: DC
  Administered 2019-10-16 – 2019-10-21 (×7): 40 mg via SUBCUTANEOUS
  Filled 2019-10-15 (×8): qty 0.4

## 2019-10-15 MED ORDER — SODIUM CHLORIDE 0.9 % IV SOLN: INTRAVENOUS | Status: DC

## 2019-10-15 MED ORDER — SODIUM CHLORIDE 0.9 % IV BOLUS
1000.0000 mL | Freq: Once | INTRAVENOUS | Status: AC
  Administered 2019-10-15: 1000 mL via INTRAVENOUS

## 2019-10-15 NOTE — ED Provider Notes (Addendum)
[facility]-EMERGENCY DEPT Provider Note   CSN: [id number] Arrival date & time: 10/15/19  1742     History Chief Complaint  Patient presents with   Leg Pain    Kim Farrell is a 24 y.o. female w PMHx nontraumatic/postviral rhabdomyolysis, presenting to the emergency department with complaint of significant pain and cramping to bilateral posterior thighs that began on Thursday.  She states that over the week she did do 2 days of heavy lifting with her job.  She also got the second dose of the Pfizer COVID-19 vaccine on Monday.  She states this feels like rhabdomyolysis, which she has had 3 times before.  She states usually she has a viral illness like a cold prior to onset, however she thinks maybe was due to the heavy lifting this week.  She has not had dark urine like she has had in the past.  She had a little bit of abdominal pain yesterday though has resolved.  Denies any other associated symptoms.  The history is provided by the patient.       Past Medical History:  Diagnosis Date   Allergy    pollen, scallops, cantelope,    Eczema    Obesity    PCOS (polycystic ovarian syndrome) 2017   pt self reported as reason for being on metformin   Pre-diabetes    Rhabdomyolysis    Rhabdomyolysis 03/2016    Patient Active Problem List   Diagnosis Date Noted   Rhabdomyolysis 10/15/2019   Hypokalemia 04/09/2016   Diabetes mellitus during pregnancy, antepartum 04/09/2016   GERD (gastroesophageal reflux disease) 04/09/2016   Morbid obesity (HCC) 04/09/2016   H/O multiple allergies 04/09/2016   Prediabetes    Non-traumatic rhabdomyolysis    Viral myositis 06/10/2015   Elevated CK    Myalgia     Past Surgical History:  Procedure Laterality Date   ADENOIDECTOMY  2015   MUSCLE BIOPSY Right 04/14/2016   Procedure: MUSCLE BIOPSY;  Surgeon: Berna Bue, MD;  Location: [facility] OR;  Service: General;  Laterality: Right;    TONSILLECTOMY       OB History   No obstetric history on file.     Family History  Problem Relation Age of Onset   Allergic rhinitis Maternal Aunt    Angioedema Mother        only lip for 1 year in 2011 with hives   Hypertension Mother    Hyperlipidemia Mother    Schizophrenia Maternal Uncle    Diabetes Maternal Grandmother    Hypertension Maternal Grandmother    Asthma Neg Hx    Atopy Neg Hx    Eczema Neg Hx    Immunodeficiency Neg Hx    Urticaria Neg Hx    Thyroid disease Neg Hx     Social History   Tobacco Use   Smoking status: Never Smoker   Smokeless tobacco: Never Used  Substance Use Topics   Alcohol use: No   Drug use: No    Home Medications Prior to Admission medications   Medication Sig Start Date End Date Taking? Authorizing Provider  ibuprofen (ADVIL) 200 MG tablet Take 400 mg by mouth every 6 (six) hours as needed for fever or headache.    Yes [provider]  EPINEPHrine 0.3 mg/0.3 mL IJ SOAJ injection Inject 0.3 mLs (0.3 mg total) into the muscle once. Patient not taking: Reported on 10/15/2019 03/06/15   Linna Hoff, MD  ibuprofen (ADVIL,MOTRIN) 800 MG tablet Take 1  tablet (800 mg total) by mouth every 8 (eight) hours as needed. Patient not taking: Reported on 10/15/2019 04/25/18   Dalia Heading, PA-C  sucralfate (CARAFATE) 1 GM/10ML suspension Take 10 mLs (1 g total) by mouth 4 (four) times daily -  with meals and at bedtime. Patient not taking: Reported on 03/21/2017 04/04/16   Pisciotta, Elmyra Ricks, PA-C    Allergies    Cantaloupe (diagnostic), Other, and Scallops [shellfish allergy]  Review of Systems   Review of Systems  Musculoskeletal: Positive for myalgias.  All other systems reviewed and are negative.   Physical Exam Updated Vital Signs BP 111/83 (BP Location: Left Arm)    Pulse 80    Temp 99.1 F (37.3 C) (Oral)    Resp 16    SpO2 100%   Physical Exam Vitals and nursing note reviewed.  Constitutional:       General: She is not in acute distress.    Appearance: She is well-developed.  HENT:     Head: Normocephalic and atraumatic.  Eyes:     Conjunctiva/sclera: Conjunctivae normal.  Cardiovascular:     Rate and Rhythm: Normal rate and regular rhythm.  Pulmonary:     Effort: Pulmonary effort is normal. No respiratory distress.     Breath sounds: Normal breath sounds.  Abdominal:     General: Bowel sounds are normal.     Palpations: Abdomen is soft.     Tenderness: There is no abdominal tenderness.  Musculoskeletal:     Comments: Posterior thighs are firm and exquisitely tender.  No skin changes.  Skin:    General: Skin is warm.  Neurological:     Mental Status: She is alert.  Psychiatric:        Behavior: Behavior normal.     ED Results / Procedures / Treatments   Labs (all labs ordered are listed, but only abnormal results are displayed) Labs Reviewed  COMPREHENSIVE METABOLIC PANEL - Abnormal; Notable for the following components:      Result Value   AST 158 (*)    ALT 74 (*)    All other components within normal limits  CK - Abnormal; Notable for the following components:   Total CK 39,805 (*)    All other components within normal limits  URINALYSIS, ROUTINE W REFLEX MICROSCOPIC - Abnormal; Notable for the following components:   APPearance HAZY (*)    All other components within normal limits  SARS CORONAVIRUS 2 BY RT PCR (HOSPITAL ORDER, [facility])  CBC  I-STAT BETA HCG BLOOD, ED ([facility], [facility], [facility] ONLY)    EKG None  Radiology No results found.  Procedures Procedures (including critical care time)  Medications Ordered in ED Medications  sodium chloride 0.9 % bolus 1,000 mL (0 mLs Intravenous Stopped 10/15/19 2323)  sodium chloride 0.9 % bolus 1,000 mL (1,000 mLs Intravenous New Bag/Given 10/15/19 2326)    ED Course  I have reviewed the triage vital signs and the nursing notes.  Pertinent labs & imaging results that were available  during my care of the patient were reviewed by me and considered in my medical decision making (see chart for details).  Clinical Course as of Oct 15 2331  Sun Oct 15, 2019  2328 Dr. Myna Hidalgo accepting admission   [JR]    Clinical Course User Index [JR] Stanislaw Acton, [place] N, PA-C   MDM Rules/Calculators/A&P  Patient presents to the ED with complaint of significant bilateral posterior thigh cramping and pain began on Thursday.  She has had 3 episodes of rhabdomyolysis in the past and this feels very similar.  On Monday of this week, she had the second dose of the [employer] vaccine.  Later in the week she had some heavy lifting at her job for 2 days and thinks this may have caused her symptoms.  She states she has not had dark urine like she usually has had in the past.  On exam, bilateral posterior thighs are exquisitely tender and firm.  No skin changes.  No fevers, stable vital signs.  She has been given IV fluids in the ED.  Labs with slightly elevated LFTs, and significantly elevated CK at 39,800.  Normal renal function.  UA is negative.  Patient will be vented to the hospital service for further management.   Final Clinical Impression(s) / ED Diagnoses Final diagnoses:  Non-traumatic rhabdomyolysis    Rx / DC Orders ED Discharge Orders    None       Saharra Santo, [place] N, PA-C 10/15/19 2334    Alaia Lordi, [place] N, PA-C 10/15/19 2335    Tilden Fossa, MD 10/16/19 [phone]

## 2019-10-15 NOTE — ED Triage Notes (Signed)
Patient c/o leg pain and cramping x3 days after working and overusing legs. Hx rhabdomyolysis.

## 2019-10-15 NOTE — ED Notes (Signed)
Patient made aware urine sample is needed. States she does not need to urinate at this time.

## 2019-10-15 NOTE — H&P (Signed)
History and Physical    Briceyda Acquanetta Cabanilla XTG:[id number] DOB: 03-19-1996 DOA: 10/15/2019  PCP: Lucianne Lei, MD   Patient coming from: Home   Chief Complaint: Bilateral leg pain   HPI: Kim Farrell is a 24 y.o. female with medical history significant for GERD, BMI 33, eczema, and recurrent rhabdomyolysis, now presenting to emergency department with bilateral lower extremity pain.  Patient reports aching pain involving bilateral lower extremities that began approximately 4 days ago after she had been doing some heavy lifting at work.  She reports that the symptoms are similar to when she has had rhabdomyolysis 3 times previously.  She denies any recent fevers or chills, denies any change in her urination, denies abdominal or flank pain, and notes that she had the second dose of Pfizer COVID-19 vaccine on 10/10/2019.  Patient underwent muscle biopsy in 2017 for evaluation of recurrent rhabdomyolysis and there was one small focus of lymphocytic inflammation. She denies family history of rhabdomyolysis.   ED Course: Upon arrival to the ED, patient is found to be afebrile, saturating well on room air, and with normal blood pressure.  Chemistry panel is notable for AST 158 and ALT 74.  Serum CK is elevated to 39,805.  CBC is unremarkable.  Urine is yellow with no "heme" on dipstick.  COVID-19 screening test is negative.  Patient was given 2 L of normal saline in the ED.  Review of Systems:  All other systems reviewed and apart from HPI, are negative.  Past Medical History:  Diagnosis Date  . Allergy    pollen, scallops, cantelope,   . Eczema   . Obesity   . PCOS (polycystic ovarian syndrome) 2017   pt self reported as reason for being on metformin  . Pre-diabetes   . Rhabdomyolysis   . Rhabdomyolysis 03/2016    Past Surgical History:  Procedure Laterality Date  . ADENOIDECTOMY  2015  . MUSCLE BIOPSY Right 04/14/2016   Procedure: MUSCLE BIOPSY;  Surgeon: Clovis Riley,  MD;  Location: [facility];  Service: General;  Laterality: Right;  . TONSILLECTOMY       reports that she has never smoked. She has never used smokeless tobacco. She reports that she does not drink alcohol and does not use drugs.  Allergies  Allergen Reactions  . Cantaloupe (Diagnostic) Swelling    lips  . Other Other (See Comments)    Cardic DM (a cough medicine) turns red   . Scallops [Shellfish Allergy] Swelling    Lip swelling    Family History  Problem Relation Age of Onset  . Allergic rhinitis Maternal Aunt   . Angioedema Mother        only lip for 1 year in 2011 with hives  . Hypertension Mother   . Hyperlipidemia Mother   . Schizophrenia Maternal Uncle   . Diabetes Maternal Grandmother   . Hypertension Maternal Grandmother   . Asthma Neg Hx   . Atopy Neg Hx   . Eczema Neg Hx   . Immunodeficiency Neg Hx   . Urticaria Neg Hx   . Thyroid disease Neg Hx      Prior to Admission medications   Medication Sig Start Date End Date Taking? Authorizing Provider  ibuprofen (ADVIL) 200 MG tablet Take 400 mg by mouth every 6 (six) hours as needed for fever or headache.    Yes [provider]  EPINEPHrine 0.3 mg/0.3 mL IJ SOAJ injection Inject 0.3 mLs (0.3 mg total) into the muscle once.  Patient not taking: Reported on 10/15/2019 03/06/15   Kindl, James D, MD  ibuprofen (ADVIL,MOTRIN) 800 MG tablet Take 1 tablet (800 mg total) by mouth every 8 (eight) hours as needed. Patient not taking: Reported on 10/15/2019 04/25/18   Lawyer, Christopher, PA-C  sucralfate (CARAFATE) 1 GM/10ML suspension Take 10 mLs (1 g total) by mouth 4 (four) times daily -  with meals and at bedtime. Patient not taking: Reported on 03/21/2017 04/04/16   Pisciotta, Nicole, PA-C    Physical Exam: Vitals:   10/15/19 1957 10/15/19 2030 10/15/19 2156 10/15/19 2315  BP: (!) 144/98 122/77 (!) 132/92 111/83  Pulse: 92 79 69 80  Resp: 18 17 16 16  Temp:      TempSrc:      SpO2: 100% 100% 100% 100%     Constitutional: NAD, calm  Eyes: PERTLA, lids and conjunctivae normal ENMT: Mucous membranes are moist. Posterior pharynx clear of any exudate or lesions.   Neck: normal, supple, no masses, no thyromegaly Respiratory:  no wheezing, no crackles. No accessory muscle use.  Cardiovascular: S1 & S2 heard, regular rate and rhythm. No extremity edema.   Abdomen: No distension, no tenderness, soft. Bowel sounds active.  Musculoskeletal: no clubbing / cyanosis. No joint deformity upper and lower extremities.   Skin: no significant rashes, lesions, ulcers. Warm, dry, well-perfused. Neurologic: CN 2-12 grossly intact. Sensation intact. Moving all extremities.  Psychiatric: Alert and oriented to person, place, and situation. Calm and cooperative.    Labs and Imaging on Admission: I have personally reviewed following labs and imaging studies  CBC: Recent Labs  Lab 10/15/19 1958  WBC 4.3  HGB 14.3  HCT 44.2  MCV 86.5  PLT 222   Basic Metabolic Panel: Recent Labs  Lab 10/15/19 1958  NA 140  K 3.6  CL 106  CO2 25  GLUCOSE 97  BUN 9  CREATININE 0.62  CALCIUM 9.0   GFR: CrCl cannot be calculated (Unknown ideal weight.). Liver Function Tests: Recent Labs  Lab 10/15/19 1958  AST 158*  ALT 74*  ALKPHOS 73  BILITOT 0.6  PROT 7.7  ALBUMIN 3.7   No results for input(s): LIPASE, AMYLASE in the last 168 hours. No results for input(s): AMMONIA in the last 168 hours. Coagulation Profile: No results for input(s): INR, PROTIME in the last 168 hours. Cardiac Enzymes: Recent Labs  Lab 10/15/19 1958  CKTOTAL 39,805*   BNP (last 3 results) No results for input(s): PROBNP in the last 8760 hours. HbA1C: No results for input(s): HGBA1C in the last 72 hours. CBG: No results for input(s): GLUCAP in the last 168 hours. Lipid Profile: No results for input(s): CHOL, HDL, LDLCALC, TRIG, CHOLHDL, LDLDIRECT in the last 72 hours. Thyroid Function Tests: No results for input(s): TSH,  T4TOTAL, FREET4, T3FREE, THYROIDAB in the last 72 hours. Anemia Panel: No results for input(s): VITAMINB12, FOLATE, FERRITIN, TIBC, IRON, RETICCTPCT in the last 72 hours. Urine analysis:    Component Value Date/Time   COLORURINE YELLOW 10/15/2019 2236   APPEARANCEUR HAZY (A) 10/15/2019 2236   LABSPEC 1.023 10/15/2019 2236   PHURINE 6.0 10/15/2019 2236   GLUCOSEU NEGATIVE 10/15/2019 2236   HGBUR NEGATIVE 10/15/2019 2236   BILIRUBINUR NEGATIVE 10/15/2019 2236   KETONESUR NEGATIVE 10/15/2019 2236   PROTEINUR NEGATIVE 10/15/2019 2236   NITRITE NEGATIVE 10/15/2019 2236   LEUKOCYTESUR NEGATIVE 10/15/2019 2236   Sepsis Labs: @LABRCNTIP(procalcitonin:4,lacticidven:4) ) Recent Results (from the past 240 hour(s))  SARS Coronavirus 2 by RT PCR (hospital order, performed   in [facility] hospital lab) Nasopharyngeal Nasopharyngeal Swab     Status: None   Collection Time: 10/15/19  9:51 PM   Specimen: Nasopharyngeal Swab  Result Value Ref Range Status   SARS Coronavirus 2 NEGATIVE NEGATIVE Final    Comment: (NOTE) SARS-CoV-2 target nucleic acids are NOT DETECTED.  The SARS-CoV-2 RNA is generally detectable in upper and lower respiratory specimens during the acute phase of infection. The lowest concentration of SARS-CoV-2 viral copies this assay can detect is 250 copies / mL. A negative result does not preclude SARS-CoV-2 infection and should not be used as the sole basis for treatment or other patient management decisions.  A negative result may occur with improper specimen collection / handling, submission of specimen other than nasopharyngeal swab, presence of viral mutation(s) within the areas targeted by this assay, and inadequate number of viral copies (<250 copies / mL). A negative result must be combined with clinical observations, patient history, and epidemiological information.  Fact Sheet for Patients:   https://www.fda.gov/media/136312/download  Fact Sheet for Healthcare  Providers: https://www.fda.gov/media/136313/download  This test is not yet approved or  cleared by the [place] FDA and has been authorized for detection and/or diagnosis of SARS-CoV-2 by FDA under an Emergency Use Authorization (EUA).  This EUA will remain in effect (meaning this test can be used) for the duration of the COVID-19 declaration under Section 564(b)(1) of the Act, 21 U.S.C. section 360bbb-3(b)(1), unless the authorization is terminated or revoked sooner.  Performed at [facility], [address] [place], [place] [address]      Radiological Exams on Admission: No results found.  EKG: Independently reviewed. Sinus rhythm, rate 81, QTc 431.   Assessment/Plan   1. Rhabdomyolysis  - Patient with recurrent rhabdomyolysis presenting with bilateral leg pain after heavy exertion and is found to have CK of 39,809 with normal renal function, yellow urine and no "heme" on dipstick  - Compartments are soft  - She was given 2 liters IVF in ED  - Continue IVF hydration, trend CK levels, monitor renal function and electrolytes   2. Elevated transaminases  - AST is 158 and ALT 74 with normal alk phos and bilirubin and no abdominal pain  - Secondary to rhabdomyolysis, continue IVF hydration and trend     DVT prophylaxis: Lovenox  Code Status: Full  Family Communication: Discussed with patient  Disposition Plan:  Patient is from: Home  Anticipated d/c is to: Home  Anticipated d/c date is: 10/17/19 Patient currently: Pending improvement in rhabdomyolysis  Consults called: none  Admission status: Inpatient     Timothy S Opyd, MD [facility] Pager: See [email or web address]  If 7AM-7PM, please contact the daytime attending [email or web address]  10/15/2019, 11:43 PM    

## 2019-10-16 ENCOUNTER — Other Ambulatory Visit: Payer: Self-pay

## 2019-10-16 LAB — COMPREHENSIVE METABOLIC PANEL
ALT: 75 U/L — ABNORMAL HIGH (ref 0–44)
AST: 166 U/L — ABNORMAL HIGH (ref 15–41)
Albumin: 3.1 g/dL — ABNORMAL LOW (ref 3.5–5.0)
Alkaline Phosphatase: 57 U/L (ref 38–126)
Anion gap: 4 — ABNORMAL LOW (ref 5–15)
BUN: 9 mg/dL (ref 6–20)
CO2: 25 mmol/L (ref 22–32)
Calcium: 8 mg/dL — ABNORMAL LOW (ref 8.9–10.3)
Chloride: 110 mmol/L (ref 98–111)
Creatinine, Ser: 0.63 mg/dL (ref 0.44–1.00)
GFR calc Af Amer: >60 mL/min (ref >60)
GFR calc non Af Amer: >60 mL/min (ref >60)
Glucose, Bld: 88 mg/dL (ref 70–99)
Potassium: 3.5 mmol/L (ref 3.5–5.1)
Sodium: 139 mmol/L (ref 135–145)
Total Bilirubin: 0.5 mg/dL (ref 0.3–1.2)
Total Protein: 6.1 g/dL — ABNORMAL LOW (ref 6.5–8.1)

## 2019-10-16 LAB — CBC
HCT: 38.7 % (ref 36.0–46.0)
Hemoglobin: 12.2 g/dL (ref 12.0–15.0)
MCH: 27.7 pg (ref 26.0–34.0)
MCHC: 31.5 g/dL (ref 30.0–36.0)
MCV: 87.8 fL (ref 80.0–100.0)
Platelets: 195 10*3/uL (ref 150–400)
RBC: 4.41 MIL/uL (ref 3.87–5.11)
RDW: 11.9 % (ref 11.5–15.5)
WBC: 4.8 10*3/uL (ref 4.0–10.5)
nRBC: 0 % (ref 0.0–0.2)

## 2019-10-16 LAB — MAGNESIUM: Magnesium: 1.9 mg/dL (ref 1.7–2.4)

## 2019-10-16 LAB — HIV ANTIBODY (ROUTINE TESTING W REFLEX): HIV Screen 4th Generation wRfx: NONREACTIVE

## 2019-10-16 LAB — CK: Total CK: 41525 U/L — ABNORMAL HIGH (ref 38–234)

## 2019-10-16 MED ORDER — TRAMADOL HCL 50 MG PO TABS
50.0000 mg | ORAL_TABLET | Freq: Four times a day (QID) | ORAL | Status: DC | PRN
  Administered 2019-10-17 – 2019-10-20 (×6): 50 mg via ORAL
  Filled 2019-10-16 (×6): qty 1

## 2019-10-16 MED ORDER — SODIUM CHLORIDE 0.9 % IV SOLN: INTRAVENOUS | Status: DC

## 2019-10-16 MED ORDER — OXYCODONE HCL 5 MG PO TABS
5.0000 mg | ORAL_TABLET | ORAL | Status: DC | PRN
  Administered 2019-10-16 – 2019-10-18 (×3): 5 mg via ORAL
  Filled 2019-10-16 (×3): qty 1

## 2019-10-16 NOTE — Progress Notes (Signed)
Kim Farrell  VEH:[id number] DOB: 09/12/1995 DOA: 10/15/2019 PCP: Lucianne Lei, MD    Brief Narrative:  24 year old with a history of morbid obesity, GERD, eczema, and recurrent rhabdomyolysis who presented to the ED with complaints of severe bilateral lower extremity achy pain x4 days after "doing some heavy lifting at work."  The symptoms were consistent with her 3 previous episodes of rhabdomyolysis.  She underwent a muscle biopsy in 2017 which revealed only 1 small focus of lymphocytic inflammation.  There is no other family history of rhabdomyolysis.  In the ED her serum total CK was found to be approximately 40,000.  Significant Events: 6/20 admit via [facility]  Antimicrobials:  None  Subjective: The patient reports no known family history on either side of similar incidents.  She denies use of any illicit substances or excessive exercise.  She confirmed that she was "simply doing her routine work" prior to the onset of her muscular soreness.  She denies shortness of breath abdominal pain chest pain nausea or vomiting.  Assessment & Plan:  Nontraumatic severe rhabdomyolysis No evidence of compartment syndrome on physical exam at time of admission -clearly there is an underlying propensity for rhabdomyolysis in this otherwise healthy 24 year old that must be further investigated -I will review the work-up that has been accomplished thus far but suspect she may require outpatient referral, perhaps to a rheumatology clinic at a tertiary care center -continue to hydrate, trend CK total, and monitor renal function closely  Elevated transaminases Due to above -follow with volume resuscitation  DVT prophylaxis: Lovenox Code Status: FULL CODE Family Communication:  Status is: Inpatient  Remains inpatient appropriate because:Inpatient level of care appropriate due to severity of illness   Dispo: The patient is from: Home              Anticipated d/c is to: Home               Anticipated d/c date is: 2 days              Patient currently is not medically stable to d/c.   Consultants:  none  Objective: Blood pressure (!) 127/91, pulse 70, temperature 98.3 F (36.8 C), temperature source Oral, resp. rate 16, height 5\' 4"  (1.626 m), weight 106.3 kg, SpO2 100 %.  Intake/Output Summary (Last 24 hours) at 10/16/2019 0817 Last data filed at 10/16/2019 0641 Gross per 24 hour  Intake 2294.34 ml  Output 900 ml  Net 1394.34 ml   Filed Weights   10/16/19 0028  Weight: 106.3 kg    Examination: General: No acute respiratory distress Lungs: Clear to auscultation bilaterally without wheezes or crackles Cardiovascular: Regular rate and rhythm without murmur gallop or rub normal S1 and S2 Abdomen: Nontender, nondistended, soft, bowel sounds positive, no rebound, no ascites, no appreciable mass Extremities: No significant cyanosis, clubbing, or edema bilateral lower extremities  CBC: Recent Labs  Lab 10/15/19 1958 10/16/19 0458  WBC 4.3 4.8  HGB 14.3 12.2  HCT 44.2 38.7  MCV 86.5 87.8  PLT 222 836   Basic Metabolic Panel: Recent Labs  Lab 10/15/19 1958 10/16/19 0458  NA 140 139  K 3.6 3.5  CL 106 110  CO2 25 25  GLUCOSE 97 88  BUN 9 9  CREATININE 0.62 0.63  CALCIUM 9.0 8.0*  MG  --  1.9   GFR: Estimated Creatinine Clearance: 132.2 mL/min (by C-G formula based on SCr of 0.63 mg/dL).  Liver Function Tests: Recent Labs  Lab 10/15/19 1958 10/16/19 0458  AST 158* 166*  ALT 74* 75*  ALKPHOS 73 57  BILITOT 0.6 0.5  PROT 7.7 6.1*  ALBUMIN 3.7 3.1*    Cardiac Enzymes: Recent Labs  Lab 10/15/19 1958 10/16/19 0458  CKTOTAL 39,805* 41,525*    HbA1C: Hgb A1c MFr Bld  Date/Time Value Ref Range Status  04/09/2016 11:11 AM 5.5 4.8 - 5.6 % Final    Comment:    (NOTE)         Pre-diabetes: 5.7 - 6.4         Diabetes: >6.4         Glycemic control for adults with diabetes: <7.0      Recent Results (from the past 240 hour(s))  SARS  Coronavirus 2 by RT PCR (hospital order, performed in [facility] hospital lab) Nasopharyngeal Nasopharyngeal Swab     Status: None   Collection Time: 10/15/19  9:51 PM   Specimen: Nasopharyngeal Swab  Result Value Ref Range Status   SARS Coronavirus 2 NEGATIVE NEGATIVE Final    Comment: (NOTE) SARS-CoV-2 target nucleic acids are NOT DETECTED.  The SARS-CoV-2 RNA is generally detectable in upper and lower respiratory specimens during the acute phase of infection. The lowest concentration of SARS-CoV-2 viral copies this assay can detect is 250 copies / mL. A negative result does not preclude SARS-CoV-2 infection and should not be used as the sole basis for treatment or other patient management decisions.  A negative result may occur with improper specimen collection / handling, submission of specimen other than nasopharyngeal swab, presence of viral mutation(s) within the areas targeted by this assay, and inadequate number of viral copies (<250 copies / mL). A negative result must be combined with clinical observations, patient history, and epidemiological information.  Fact Sheet for Patients:   BoilerBrush.com.cy  Fact Sheet for Healthcare Providers: https://pope.com/  This test is not yet approved or  cleared by the [place] FDA and has been authorized for detection and/or diagnosis of SARS-CoV-2 by FDA under an Emergency Use Authorization (EUA).  This EUA will remain in effect (meaning this test can be used) for the duration of the COVID-19 declaration under Section 564(b)(1) of the Act, 21 U.S.C. section 360bbb-3(b)(1), unless the authorization is terminated or revoked sooner.  Performed at [facility], [address] [place], [place] [address]      Scheduled Meds: . enoxaparin (LOVENOX) injection  40 mg Subcutaneous QHS   Continuous Infusions: . sodium chloride 125 mL/hr at 10/16/19 0007      LOS: 1 day   Lonia Blood, MD [facility] Office  [phone] Pager - Text Page per Loretha Stapler  If 7PM-7AM, please contact night-coverage per Amion 10/16/2019, 8:17 AM

## 2019-10-17 LAB — CK
Total CK: >50000 U/L — ABNORMAL HIGH (ref 38–234)
Total CK: >50000 U/L — ABNORMAL HIGH (ref 38–234)

## 2019-10-17 LAB — COMPREHENSIVE METABOLIC PANEL
ALT: 150 U/L — ABNORMAL HIGH (ref 0–44)
AST: 312 U/L — ABNORMAL HIGH (ref 15–41)
Albumin: 3.5 g/dL (ref 3.5–5.0)
Alkaline Phosphatase: 65 U/L (ref 38–126)
Anion gap: 13 (ref 5–15)
BUN: 6 mg/dL (ref 6–20)
CO2: 19 mmol/L — ABNORMAL LOW (ref 22–32)
Calcium: 8.2 mg/dL — ABNORMAL LOW (ref 8.9–10.3)
Chloride: 104 mmol/L (ref 98–111)
Creatinine, Ser: 0.52 mg/dL (ref 0.44–1.00)
GFR calc Af Amer: >60 mL/min (ref >60)
GFR calc non Af Amer: >60 mL/min (ref >60)
Glucose, Bld: 79 mg/dL (ref 70–99)
Potassium: 4.4 mmol/L (ref 3.5–5.1)
Sodium: 136 mmol/L (ref 135–145)
Total Bilirubin: 0.9 mg/dL (ref 0.3–1.2)
Total Protein: 6.9 g/dL (ref 6.5–8.1)

## 2019-10-17 LAB — CBC
HCT: 42.6 % (ref 36.0–46.0)
Hemoglobin: 13.6 g/dL (ref 12.0–15.0)
MCH: 28 pg (ref 26.0–34.0)
MCHC: 31.9 g/dL (ref 30.0–36.0)
MCV: 87.8 fL (ref 80.0–100.0)
Platelets: 202 10*3/uL (ref 150–400)
RBC: 4.85 MIL/uL (ref 3.87–5.11)
RDW: 11.9 % (ref 11.5–15.5)
WBC: 5.1 10*3/uL (ref 4.0–10.5)
nRBC: 0 % (ref 0.0–0.2)

## 2019-10-17 MED ORDER — SODIUM CHLORIDE 0.9 % IV SOLN: INTRAVENOUS | Status: AC

## 2019-10-17 MED ORDER — ACETAMINOPHEN 325 MG PO TABS
650.0000 mg | ORAL_TABLET | Freq: Four times a day (QID) | ORAL | Status: DC | PRN
  Administered 2019-10-18: 650 mg via ORAL
  Filled 2019-10-17: qty 2

## 2019-10-17 MED ORDER — SODIUM CHLORIDE 0.9 % IV SOLN: INTRAVENOUS | Status: DC

## 2019-10-17 MED ORDER — ACETAMINOPHEN 325 MG PO TABS
650.0000 mg | ORAL_TABLET | Freq: Once | ORAL | Status: AC
  Administered 2019-10-17: 650 mg via ORAL
  Filled 2019-10-17: qty 2

## 2019-10-17 NOTE — Progress Notes (Signed)
Kim Farrell  XNA:[id number] DOB: 1995/06/11 DOA: 10/15/2019 PCP: Renaye Rakers, MD    Brief Narrative:  24 year old with a history of morbid obesity, GERD, eczema, and recurrent rhabdomyolysis who presented to the ED with complaints of severe bilateral lower extremity achy pain x4 days after "doing some heavy lifting at work."  The symptoms were consistent with her 3 previous episodes of rhabdomyolysis.  She underwent a muscle biopsy in 2017 which revealed only 1 small focus of lymphocytic inflammation.  There is no other family history of rhabdomyolysis.  In the ED her serum total CK was found to be approximately 40,000.  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 -muscle biopsy in 2017 failed to reveal any pathopneumonic findings -continue to hydrate, trend CK total, and monitor renal function closely -with upward trending CK at greater than 50,000 today we will increase volume resuscitation -fortunately renal function remains stable at this time - referral to a tertiary care center in the outpatient setting would be appropriate for further/genetic testing  Elevated transaminases Due to above -trending upward -continue to hydrate and follow  DVT prophylaxis: Lovenox Code Status: FULL CODE Family Communication: Spoke with family in the room Status is: Inpatient  Remains inpatient appropriate because:Inpatient level of care appropriate due to severity of illness   Dispo: The patient is from: Home               Anticipated d/c is to: Home              Anticipated d/c date is: 2 days              Patient currently is not medically stable to d/c.   Consultants:  none  Objective: Blood pressure 127/71, pulse 77, temperature 98.6 F (37 C), temperature source Oral, resp. rate 15, height 5\' 4"  (1.626 m), weight 106.3 kg, SpO2 98 %.  Intake/Output Summary (Last 24 hours) at 10/17/2019 0817 Last data filed at 10/17/2019 0651 Gross per 24 hour  Intake 4082.38 ml  Output 2600 ml  Net 1482.38 ml   Filed Weights   10/16/19 0028  Weight: 106.3 kg    Examination: General: No acute respiratory distress Lungs: Clear to auscultation bilaterally without wheezes or crackles Cardiovascular: Regular rate and rhythm without murmur gallop or rub normal S1 and S2 Abdomen: NT/ND, soft, BS positive, no rebound Extremities: No significant tenderness to bilateral lower extremities with no significant edema -no evidence of compartment syndrome whatsoever  CBC: Recent Labs  Lab 10/15/19 1958 10/16/19 0458 10/17/19 0503  WBC 4.3 4.8 5.1  HGB 14.3 12.2 13.6  HCT 44.2 38.7 42.6  MCV 86.5 87.8 87.8  PLT 222 195 202   Basic Metabolic Panel: Recent Labs  Lab 10/15/19 1958 10/16/19 0458 10/17/19 0503  NA 140 139 136  K 3.6 3.5 4.4  CL 106 110 104  CO2 25 25 19*  GLUCOSE 97 88 79  BUN 9 9 6   CREATININE 0.62 0.63 0.52  CALCIUM 9.0 8.0* 8.2*  MG  --  1.9  --    GFR: Estimated Creatinine Clearance: 132.2  mL/min (by C-G formula based on SCr of 0.52 mg/dL).  Liver Function Tests: Recent Labs  Lab 10/15/19 1958 10/16/19 0458 10/17/19 0503  AST 158* 166* 312*  ALT 74* 75* 150*  ALKPHOS 73 57 65  BILITOT 0.6 0.5 0.9  PROT 7.7 6.1* 6.9  ALBUMIN 3.7 3.1* 3.5    Cardiac Enzymes: Recent Labs  Lab 10/15/19 1958 10/16/19 0458 10/17/19 0503  CKTOTAL 39,805* 41,525* >50,000*    HbA1C: Hgb A1c MFr Bld  Date/Time Value Ref Range Status  04/09/2016 11:11 AM 5.5 4.8 - 5.6 % Final    Comment:      (NOTE)         Pre-diabetes: 5.7 - 6.4         Diabetes: >6.4         Glycemic control for adults with diabetes: <7.0      Recent Results (from the past 240 hour(s))  SARS Coronavirus 2 by RT PCR (hospital order, performed in [place] hospital lab) Nasopharyngeal Nasopharyngeal Swab     Status: None   Collection Time: 10/15/19  9:51 PM   Specimen: Nasopharyngeal Swab  Result Value Ref Range Status   SARS Coronavirus 2 NEGATIVE NEGATIVE Final    Comment: (NOTE) SARS-CoV-2 target nucleic acids are NOT DETECTED.  The SARS-CoV-2 RNA is generally detectable in upper and lower respiratory specimens during the acute phase of infection. The lowest concentration of SARS-CoV-2 viral copies this assay can detect is 250 copies / mL. A negative result does not preclude SARS-CoV-2 infection and should not be used as the sole basis for treatment or other patient management decisions.  A negative result may occur with improper specimen collection / handling, submission of specimen other than nasopharyngeal swab, presence of viral mutation(s) within the areas targeted by this assay, and inadequate number of viral copies (<250 copies / mL). A negative result must be combined with clinical observations, patient history, and epidemiological information.  Fact Sheet for Patients:   StrictlyIdeas.no  Fact Sheet for Healthcare Providers: BankingDealers.co.za  This test is not yet approved or  cleared by the [place] FDA and has been authorized for detection and/or diagnosis of SARS-CoV-2 by FDA under an Emergency Use Authorization (EUA).  This EUA will remain in effect (meaning this test can be used) for the duration of the COVID-19 declaration under Section 564(b)(1) of the Act, 21 U.S.C. section 360bbb-3(b)(1), unless the authorization is terminated or revoked sooner.  Performed at [facility], [place] [address], [place], [place] [address]      Scheduled Meds: . enoxaparin (LOVENOX) injection  40 mg Subcutaneous QHS   Continuous Infusions: . sodium chloride 150 mL/hr at 10/17/19 0452     LOS: 2 days   Cherene Altes, MD [facility] Office  [phone] Pager - Text Page per Shea Evans  If 7PM-7AM, please contact night-coverage per Amion 10/17/2019, 8:17 AM

## 2019-10-18 LAB — COMPREHENSIVE METABOLIC PANEL
ALT: 256 U/L — ABNORMAL HIGH (ref 0–44)
AST: 500 U/L — ABNORMAL HIGH (ref 15–41)
Albumin: 3.3 g/dL — ABNORMAL LOW (ref 3.5–5.0)
Alkaline Phosphatase: 72 U/L (ref 38–126)
Anion gap: 7 (ref 5–15)
BUN: 6 mg/dL (ref 6–20)
CO2: 22 mmol/L (ref 22–32)
Calcium: 8.1 mg/dL — ABNORMAL LOW (ref 8.9–10.3)
Chloride: 106 mmol/L (ref 98–111)
Creatinine, Ser: 0.48 mg/dL (ref 0.44–1.00)
GFR calc Af Amer: >60 mL/min (ref >60)
GFR calc non Af Amer: >60 mL/min (ref >60)
Glucose, Bld: 79 mg/dL (ref 70–99)
Potassium: 4.4 mmol/L (ref 3.5–5.1)
Sodium: 135 mmol/L (ref 135–145)
Total Bilirubin: 0.9 mg/dL (ref 0.3–1.2)
Total Protein: 6.8 g/dL (ref 6.5–8.1)

## 2019-10-18 LAB — CK: Total CK: >50000 U/L — ABNORMAL HIGH (ref 38–234)

## 2019-10-18 NOTE — Progress Notes (Signed)
PROGRESS NOTE    Kim Farrell    Code Status: Full Code  UKG:[id number] DOB: [birth year] DOA: 10/15/2019 LOS: 3 days  PCP: Renaye Rakers, MD CC:  Chief Complaint  Patient presents with  . 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  She was admitted for recurrent rhabdomyolysis and started on IV fluids.  Of note she underwent a muscle biopsy in 2017 which revealed only one small focus of lymphocytic inflammation.    A & P   Principal Problem:   Rhabdomyolysis Active Problems:   Elevated transaminase level   1. Nontraumatic severe rhabdomyolysis of unknown etiology a. CK level > 50 K b. Still with lower extremity tenderness to palpation c. Extensive negative work-up in the past including: Negative dsDNA, negative complement, negative IgE to allergens, negative TSH in 2018.  Negative HIV, positive IgG for EBV but negative IgM 2018, negative sickle cell screen d. Renal function intact e. LFTs continue to increase f. Will recheck TSH g. She will need an outpatient rheumatologic referral at discharge h. May need repeat muscle biopsy at some point i. Continue IV fluids  2. Elevated LFTs suspect secondary to rhabdomyolysis a. Asymptomatic b. Continue to trend  3. Obesity a. Body mass index is 40.23 kg/m.  DVT prophylaxis: enoxaparin (LOVENOX) injection 40 mg Start: 10/16/19 0015   Family Communication: No family at bedside  Disposition Plan:  Status is: Inpatient  Remains inpatient appropriate because:IV treatments appropriate due to intensity of illness or inability to take PO   Dispo: The patient  is from: Home              Anticipated d/c is to: Home              Anticipated d/c date is: 3 days              Patient currently is not medically stable to d/c.          Pressure injury documentation    None  Consultants  None  Procedures  None  Antibiotics   Anti-infectives (From admission, onward)   None        Subjective   Seen and examined at bedside no acute distress resting comfortably.  She had just been walking back from the bathroom.  States that she is very frustrated and became tearful during the exam.  Denies any alcohol or drug use or any infections recently.  States her symptoms are slightly improved but still with some tenderness to palpation of legs, most notably posterior thighs.  Reports that she has a history of intermittent angioedema without known etiology but has never needed to be intubated and does not get short of breath with this or with tongue swelling.  Currently no symptoms in that regard.  Objective   Vitals:   10/17/19 1359 10/17/19 2105 10/18/19 0531 10/18/19 1315  BP: (!) 126/98 130/90 (!) 126/91 130/84  Pulse: 93 81 79 77  Resp: 18 20 17 16   Temp: 99.2 F (37.3 C) 99.5 F (37.5 C) 98.8 F (37.1 C) 98.6 F (37 C)  TempSrc: Oral Oral Oral Oral  SpO2: 100% 100% 100%  100%  Weight:      Height:        Intake/Output Summary (Last 24 hours) at 10/18/2019 1812 Last data filed at 10/18/2019 1500 Gross per 24 hour  Intake 3739.36 ml  Output 1450 ml  Net 2289.36 ml   Filed Weights   10/16/19 0028  Weight: 106.3 kg    Examination:  Physical Exam Vitals and nursing note reviewed.  Constitutional:      Appearance: Normal appearance.  HENT:     Head: Normocephalic and atraumatic.  Eyes:     Conjunctiva/sclera: Conjunctivae normal.  Cardiovascular:     Rate and Rhythm: Normal rate and regular rhythm.  Pulmonary:     Effort: Pulmonary effort is normal.     Breath sounds: Normal breath sounds.  Abdominal:     General:  Abdomen is flat.     Palpations: Abdomen is soft.  Musculoskeletal:     Comments: Bilateral proximal anterior and posterior thigh tenderness to palpation  Skin:    Coloration: Skin is not jaundiced or pale.  Neurological:     Mental Status: She is alert. Mental status is at baseline.  Psychiatric:        Mood and Affect: Mood normal.        Behavior: Behavior normal.     Data Reviewed: I have personally reviewed following labs and imaging studies  CBC: Recent Labs  Lab 10/15/19 1958 10/16/19 0458 10/17/19 0503  WBC 4.3 4.8 5.1  HGB 14.3 12.2 13.6  HCT 44.2 38.7 42.6  MCV 86.5 87.8 87.8  PLT 222 195 202   Basic Metabolic Panel: Recent Labs  Lab 10/15/19 1958 10/16/19 0458 10/17/19 0503 10/18/19 0541  NA 140 139 136 135  K 3.6 3.5 4.4 4.4  CL 106 110 104 106  CO2 25 25 19* 22  GLUCOSE 97 88 79 79  BUN 9 9 6 6   CREATININE 0.62 0.63 0.52 0.48  CALCIUM 9.0 8.0* 8.2* 8.1*  MG  --  1.9  --   --    GFR: Estimated Creatinine Clearance: 132.2 mL/min (by C-G formula based on SCr of 0.48 mg/dL). Liver Function Tests: Recent Labs  Lab 10/15/19 1958 10/16/19 0458 10/17/19 0503 10/18/19 0541  AST 158* 166* 312* 500*  ALT 74* 75* 150* 256*  ALKPHOS 73 57 65 72  BILITOT 0.6 0.5 0.9 0.9  PROT 7.7 6.1* 6.9 6.8  ALBUMIN 3.7 3.1* 3.5 3.3*   No results for input(s): LIPASE, AMYLASE in the last 168 hours. No results for input(s): AMMONIA in the last 168 hours. Coagulation Profile: No results for input(s): INR, PROTIME in the last 168 hours. Cardiac Enzymes: Recent Labs  Lab 10/15/19 1958 10/16/19 0458 10/17/19 0503 10/17/19 1145 10/18/19 0541  CKTOTAL 39,805* 41,525* >50,000* >50,000* >50,000*   BNP (last 3 results) No results for input(s): PROBNP in the last 8760 hours. HbA1C: No results for input(s): HGBA1C in the last 72 hours. CBG: No results for input(s): GLUCAP in the last 168 hours. Lipid Profile: No results for input(s): CHOL, HDL, LDLCALC, TRIG,  CHOLHDL, LDLDIRECT in the last 72 hours. Thyroid Function Tests: No results for input(s): TSH, T4TOTAL, FREET4, T3FREE, THYROIDAB in the last 72 hours. Anemia Panel: No results for input(s): VITAMINB12, FOLATE, FERRITIN, TIBC, IRON, RETICCTPCT in the last 72 hours. Sepsis Labs: No results for input(s): PROCALCITON, LATICACIDVEN in the last 168 hours.  Recent Results (from the past 240 hour(s))  SARS Coronavirus 2 by RT PCR (hospital order, performed in [facility]  hospital lab) Nasopharyngeal Nasopharyngeal Swab     Status: None   Collection Time: 10/15/19  9:51 PM   Specimen: Nasopharyngeal Swab  Result Value Ref Range Status   SARS Coronavirus 2 NEGATIVE NEGATIVE Final    Comment: (NOTE) SARS-CoV-2 target nucleic acids are NOT DETECTED.  The SARS-CoV-2 RNA is generally detectable in upper and lower respiratory specimens during the acute phase of infection. The lowest concentration of SARS-CoV-2 viral copies this assay can detect is 250 copies / mL. A negative result does not preclude SARS-CoV-2 infection and should not be used as the sole basis for treatment or other patient management decisions.  A negative result may occur with improper specimen collection / handling, submission of specimen other than nasopharyngeal swab, presence of viral mutation(s) within the areas targeted by this assay, and inadequate number of viral copies (<250 copies / mL). A negative result must be combined with clinical observations, patient history, and epidemiological information.  Fact Sheet for Patients:   StrictlyIdeas.no  Fact Sheet for Healthcare Providers: BankingDealers.co.za  This test is not yet approved or  cleared by the [place] FDA and has been authorized for detection and/or diagnosis of SARS-CoV-2 by FDA under an Emergency Use Authorization (EUA).  This EUA will remain in effect (meaning this test can be used) for the duration of  the COVID-19 declaration under Section 564(b)(1) of the Act, 21 U.S.C. section 360bbb-3(b)(1), unless the authorization is terminated or revoked sooner.  Performed at [facility], [place] [address], [place], [place] [address]          Radiology Studies: No results found.      Scheduled Meds: . enoxaparin (LOVENOX) injection  40 mg Subcutaneous QHS   Continuous Infusions: . sodium chloride Stopped (10/18/19 1418)     Time spent: 30 minutes with over 50% of the time coordinating the patient's care    Harold Hedge, DO [facility] Pager [phone]  Call night coverage person covering after 7pm

## 2019-10-19 ENCOUNTER — Inpatient Hospital Stay (HOSPITAL_COMMUNITY): Payer: No Typology Code available for payment source

## 2019-10-19 LAB — COMPREHENSIVE METABOLIC PANEL
ALT: 562 U/L — ABNORMAL HIGH (ref 0–44)
AST: 675 U/L — ABNORMAL HIGH (ref 15–41)
Albumin: 3.3 g/dL — ABNORMAL LOW (ref 3.5–5.0)
Alkaline Phosphatase: 81 U/L (ref 38–126)
Anion gap: 9 (ref 5–15)
BUN: 8 mg/dL (ref 6–20)
CO2: 23 mmol/L (ref 22–32)
Calcium: 8.4 mg/dL — ABNORMAL LOW (ref 8.9–10.3)
Chloride: 104 mmol/L (ref 98–111)
Creatinine, Ser: 0.46 mg/dL (ref 0.44–1.00)
GFR calc Af Amer: >60 mL/min (ref >60)
GFR calc non Af Amer: >60 mL/min (ref >60)
Glucose, Bld: 88 mg/dL (ref 70–99)
Potassium: 4.4 mmol/L (ref 3.5–5.1)
Sodium: 136 mmol/L (ref 135–145)
Total Bilirubin: 0.7 mg/dL (ref 0.3–1.2)
Total Protein: 7 g/dL (ref 6.5–8.1)

## 2019-10-19 LAB — MAGNESIUM: Magnesium: 2.2 mg/dL (ref 1.7–2.4)

## 2019-10-19 LAB — CK: Total CK: >50000 U/L — ABNORMAL HIGH (ref 38–234)

## 2019-10-19 LAB — CBC
HCT: 40.9 % (ref 36.0–46.0)
Hemoglobin: 13.1 g/dL (ref 12.0–15.0)
MCH: 27.5 pg (ref 26.0–34.0)
MCHC: 32 g/dL (ref 30.0–36.0)
MCV: 85.9 fL (ref 80.0–100.0)
Platelets: 212 10*3/uL (ref 150–400)
RBC: 4.76 MIL/uL (ref 3.87–5.11)
RDW: 12.1 % (ref 11.5–15.5)
WBC: 6 10*3/uL (ref 4.0–10.5)
nRBC: 0 % (ref 0.0–0.2)

## 2019-10-19 LAB — TSH: TSH: 3.698 u[IU]/mL (ref 0.350–4.500)

## 2019-10-19 MED ORDER — SODIUM CHLORIDE 0.9 % IV BOLUS
1000.0000 mL | Freq: Once | INTRAVENOUS | Status: AC
  Administered 2019-10-19: 1000 mL via INTRAVENOUS

## 2019-10-19 NOTE — Evaluation (Signed)
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  She was admitted for recurrent rhabdomyolysis and started on IV fluids.  Of note she underwent a muscle biopsy in 2017 which revealed only one small focus of lymphocytic inflammation.  Clinical Impression  Pt admitted with above diagnosis.  Pt currently with functional limitations due to the deficits listed below (see PT Problem List). Pt will benefit from skilled PT to increase their independence and safety with mobility to allow discharge to the venue listed below.  Pt ambulated short distance in hallway.  Pt presents with wide BOS and stiff gait as she compensates to avoid LE flexion.  Pt reports she urinates in semi-standing position over toilet to avoid hip and knee flexion as this aggravates her symptoms.  Pt unable to squat and prefers to keep hip and knees more extended.  She describes her pain as cramping, burning and tingling in her buttocks and radiating "up and down" her thigh (ending at her knee).  Pt also started to have symptoms after heavy lifting.  Question more nerve involvement, possibly from back/disc source?  Pt may benefit from OP PT for further assessment and management of symptoms pending cause of her symptoms.     Follow Up Recommendations Outpatient PT    Equipment Recommendations  None recommended by PT    Recommendations for Other Services       Precautions / Restrictions Precautions Precautions: None      Mobility  Bed Mobility Overal bed  mobility: Modified Independent             General bed mobility comments: increased time  Transfers Overall transfer level: Needs assistance Equipment used: None Transfers: Sit to/from Stand Sit to Stand: Supervision         General transfer comment: supervision for safety, just awakened from sleeping and a little unsteady however improved during session, avoids hip and knee flexion while performing as well  Ambulation/Gait Ambulation/Gait assistance: Min guard;Supervision Gait Distance (Feet): 100 Feet Assistive device: None Gait Pattern/deviations: Step-through pattern;Decreased stride length;Wide base of support     General Gait Details: wide stiff BOS, pt avoids hip and knee flexion, reports cramping in posterior thigh increases with distance so only performed short distance, several standing rest breaks for "pain" which she reports as tingling, burning, cramping  Stairs            Wheelchair Mobility    Modified Rankin (Stroke Patients Only)       Balance                                             Pertinent Vitals/Pain Pain Assessment: Faces Faces Pain Scale: Hurts whole lot Pain Location: mostly L buttock and posterior thigh (ending at knee) Pain Descriptors / Indicators: Grimacing;Crying;Tingling;Sharp;Cramping;Burning Pain Intervention(s): Monitored during session;Repositioned    Home Living Family/patient expects to be discharged to:: Private residence Living Arrangements: Non-relatives/Friends   Type of Home: Apartment Home Access: Stairs to enter  Home Equipment: None      Prior Function Level of Independence: Independent               Hand Dominance        Extremity/Trunk Assessment        Lower Extremity Assessment Lower Extremity Assessment: RLE deficits/detail;LLE deficits/detail LLE Deficits / Details: reports L buttock and posterior thigh sharp pain, cramping, burning, tingling (reports  with her episodes of "rhabdo")   Pt is unable to tolerate bilateral hip and knee flexion, tends to keep as extended as possible       Communication   Communication: No difficulties  Cognition Arousal/Alertness: Awake/alert Behavior During Therapy: WFL for tasks assessed/performed Overall Cognitive Status: Within Functional Limits for tasks assessed                                        General Comments      Exercises     Assessment/Plan    PT Assessment Patient needs continued PT services  PT Problem List Decreased strength;Decreased mobility;Pain       PT Treatment Interventions DME instruction;Gait training;Balance training;Therapeutic exercise;Functional mobility training;Therapeutic activities;Stair training;Patient/family education    PT Goals (Current goals can be found in the Care Plan section)  Acute Rehab PT Goals PT Goal Formulation: With patient Time For Goal Achievement: 11/02/19 Potential to Achieve Goals: Good    Frequency Min 2X/week   Barriers to discharge        Co-evaluation               AM-PAC PT "6 Clicks" Mobility  Outcome Measure Help needed turning from your back to your side while in a flat bed without using bedrails?: A Little Help needed moving from lying on your back to sitting on the side of a flat bed without using bedrails?: A Little Help needed moving to and from a bed to a chair (including a wheelchair)?: A Little Help needed standing up from a chair using your arms (e.g., wheelchair or bedside chair)?: A Little Help needed to walk in hospital room?: A Little Help needed climbing 3-5 steps with a railing? : A Lot 6 Click Score: 17    End of Session   Activity Tolerance: Patient limited by pain Patient left: in bed;with family/visitor present;with call bell/phone within reach Nurse Communication: Mobility status PT Visit Diagnosis: Other abnormalities of gait and mobility (R26.89)    Time: 7262-0355 PT  Time Calculation (min) (ACUTE ONLY): 21 min   Charges:   PT Evaluation $PT Eval Low Complexity: 1 Low        Kati PT, DPT Acute Rehabilitation Services Pager: [phone] Office: [phone]  Trena Platt 10/19/2019, 3:37 PM

## 2019-10-19 NOTE — Plan of Care (Signed)
  Problem: Education: Goal: Knowledge of General Education information will improve Description Including pain rating scale, medication(s)/side effects and non-pharmacologic comfort measures Outcome: Progressing   Problem: Health Behavior/Discharge Planning: Goal: Ability to manage health-related needs will improve Outcome: Progressing   Problem: Clinical Measurements: Goal: Ability to maintain clinical measurements within normal limits will improve Outcome: Progressing Goal: Will remain free from infection Outcome: Progressing Goal: Diagnostic test results will improve Outcome: Progressing Goal: Respiratory complications will improve Outcome: Progressing Goal: Cardiovascular complication will be avoided Outcome: Progressing   Problem: Activity: Goal: Risk for activity intolerance will decrease Outcome: Progressing   Problem: Coping: Goal: Level of anxiety will decrease Outcome: Progressing   Problem: Pain Managment: Goal: General experience of comfort will improve Outcome: Progressing   Problem: Safety: Goal: Ability to remain free from injury will improve Outcome: Progressing   

## 2019-10-19 NOTE — Progress Notes (Signed)
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  She was admitted for recurrent rhabdomyolysis and started on IV fluids.  Of note she underwent a muscle biopsy in 2017 which revealed only one small focus of lymphocytic inflammation.   LFTs continue to trend up and a right upper quadrant ultrasound was ordered on 6/24.   A & P   Principal Problem:   Rhabdomyolysis Active Problems:   Elevated transaminase level   1. Nontraumatic severe rhabdomyolysis of unknown etiology a. CK level still> 50 K, unknown if peaked yet overnight b. Still with lower extremity tenderness to palpation c. Extensive negative work-up in the past including: Negative dsDNA, negative complement, negative IgE to allergens, negative TSH in 2018.  Negative HIV, positive IgG for EBV but negative IgM 2018, negative sickle cell screen d. TSH unremarkable e. Renal function intact f. LFTs continue to increase g. She will need an outpatient rheumatologic referral at discharge and likely genetics consult to evaluate for metabolic/genetic cause h. May need repeat muscle biopsy at some point i. IV fluid bolus and continue IV fluids  2. Asymptomatic elevated LFTs suspect secondary to rhabdomyolysis a. RUQ ultrasound  3. Obesity Body mass index is 40.23 kg/m.  DVT prophylaxis: enoxaparin (LOVENOX) injection 40 mg Start: 10/16/19 0015   Family Communication: Mother  at bedside  Disposition Plan:  Status is: Inpatient  Remains inpatient appropriate because:IV treatments appropriate due to intensity of illness or inability to take PO   Dispo: The patient is from: Home              Anticipated d/c is to: Home              Anticipated d/c date is: 3 days              Patient currently is not medically stable to d/c.          Pressure injury documentation    None  Consultants  None  Procedures  None  Antibiotics   Anti-infectives (From admission, onward)   None        Subjective   Mother at bedside denies any family history of rhabdomyolysis but admits that patient's aunt as well as herself (patient's mother) as well as the patient have suffered from occasional angioedema of the face/mouth but have not required any emergent intervention.  No known metabolic disorders.  Mother has not been in contact with patient's father so we do not know his family history.  Patient admits that she has some pain when she extends her legs but otherwise feels similar to yesterday.  No other issues  Objective   Vitals:   10/17/19 2105 10/18/19 0531 10/18/19 1315 10/19/19 0541  BP: 130/90 (!) 126/91 130/84 123/85  Pulse: 81 79 77 72  Resp: 20 17 16 16   Temp: 99.5 F (37.5 C) 98.8 F (37.1 C) 98.6 F (37 C)   TempSrc: Oral Oral  Oral   SpO2: 100% 100% 100% 100%  Weight:      Height:        Intake/Output Summary (Last 24 hours) at 10/19/2019 1414 Last data filed at 10/19/2019 1037 Gross per 24 hour  Intake 1032.22 ml  Output 2450 ml  Net -1417.78 ml   Filed Weights   10/16/19 0028  Weight: 106.3 kg    Examination:  Physical Exam Vitals and nursing note reviewed.  Constitutional:      Appearance: Normal appearance.  HENT:     Head: Normocephalic and atraumatic.  Eyes:     Conjunctiva/sclera: Conjunctivae normal.  Cardiovascular:     Rate and Rhythm: Normal rate and regular rhythm.  Pulmonary:     Effort: Pulmonary effort is  normal.     Breath sounds: Normal breath sounds.  Abdominal:     General: Abdomen is flat.     Palpations: Abdomen is soft.  Musculoskeletal:     Comments: Discomfort when extending legs  Skin:    Coloration: Skin is not jaundiced or pale.  Neurological:     Mental Status: She is alert. Mental status is at baseline.  Psychiatric:        Mood and Affect: Mood normal.        Behavior: Behavior normal.     Data Reviewed: I have personally reviewed following labs and imaging studies  CBC: Recent Labs  Lab 10/15/19 1958 10/16/19 0458 10/17/19 0503 10/19/19 0520  WBC 4.3 4.8 5.1 6.0  HGB 14.3 12.2 13.6 13.1  HCT 44.2 38.7 42.6 40.9  MCV 86.5 87.8 87.8 85.9  PLT 222 195 202 283   Basic Metabolic Panel: Recent Labs  Lab 10/15/19 1958 10/16/19 0458 10/17/19 0503 10/18/19 0541 10/19/19 0520  NA 140 139 136 135 136  K 3.6 3.5 4.4 4.4 4.4  CL 106 110 104 106 104  CO2 25 25 19* 22 23  GLUCOSE 97 88 79 79 88  BUN 9 9 6 6 8   CREATININE 0.62 0.63 0.52 0.48 0.46  CALCIUM 9.0 8.0* 8.2* 8.1* 8.4*  MG  --  1.9  --   --  2.2   GFR: Estimated Creatinine Clearance: 132.2 mL/min (by C-G formula based on SCr of 0.46 mg/dL). Liver Function Tests: Recent Labs  Lab 10/15/19 1958 10/16/19 0458 10/17/19 0503 10/18/19 0541 10/19/19 0520  AST 158* 166* 312* 500* 675*  ALT 74* 75* 150* 256* 562*  ALKPHOS 73 57 65 72 81  BILITOT 0.6 0.5 0.9 0.9 0.7  PROT 7.7 6.1* 6.9 6.8 7.0  ALBUMIN 3.7 3.1* 3.5 3.3* 3.3*   No results for input(s): LIPASE, AMYLASE in the last 168 hours. No results for input(s): AMMONIA in the last 168 hours. Coagulation Profile: No results for input(s): INR, PROTIME in the last 168 hours. Cardiac Enzymes: Recent Labs  Lab 10/16/19 0458 10/17/19 0503 10/17/19 1145 10/18/19 0541 10/19/19 0520  CKTOTAL 41,525* >50,000* >50,000* >50,000* >50,000*   BNP (last 3 results) No results for input(s): PROBNP in the last 8760 hours. HbA1C: No results for  input(s): HGBA1C in the last 72 hours. CBG: No results for input(s): GLUCAP in the last 168 hours. Lipid Profile: No results for input(s): CHOL, HDL, LDLCALC, TRIG, CHOLHDL, LDLDIRECT in the last 72 hours. Thyroid Function Tests: Recent Labs    10/19/19 0520  TSH 3.698   Anemia Panel: No results for input(s): VITAMINB12, FOLATE, FERRITIN, TIBC, IRON, RETICCTPCT in the last 72 hours. Sepsis Labs: No results for input(s): PROCALCITON, LATICACIDVEN  in the last 168 hours.  Recent Results (from the past 240 hour(s))  SARS Coronavirus 2 by RT PCR (hospital order, performed in [facility] hospital lab) Nasopharyngeal Nasopharyngeal Swab     Status: None   Collection Time: 10/15/19  9:51 PM   Specimen: Nasopharyngeal Swab  Result Value Ref Range Status   SARS Coronavirus 2 NEGATIVE NEGATIVE Final    Comment: (NOTE) SARS-CoV-2 target nucleic acids are NOT DETECTED.  The SARS-CoV-2 RNA is generally detectable in upper and lower respiratory specimens during the acute phase of infection. The lowest concentration of SARS-CoV-2 viral copies this assay can detect is 250 copies / mL. A negative result does not preclude SARS-CoV-2 infection and should not be used as the sole basis for treatment or other patient management decisions.  A negative result may occur with improper specimen collection / handling, submission of specimen other than nasopharyngeal swab, presence of viral mutation(s) within the areas targeted by this assay, and inadequate number of viral copies (<250 copies / mL). A negative result must be combined with clinical observations, patient history, and epidemiological information.  Fact Sheet for Patients:   BoilerBrush.com.cy  Fact Sheet for Healthcare Providers: https://pope.com/  This test is not yet approved or  cleared by the [place] FDA and has been authorized for detection and/or diagnosis of SARS-CoV-2 by FDA  under an Emergency Use Authorization (EUA).  This EUA will remain in effect (meaning this test can be used) for the duration of the COVID-19 declaration under Section 564(b)(1) of the Act, 21 U.S.C. section 360bbb-3(b)(1), unless the authorization is terminated or revoked sooner.  Performed at [facility], [address], [place], [place] [address]          Radiology Studies: No results found.      Scheduled Meds:  enoxaparin (LOVENOX) injection  40 mg Subcutaneous QHS   Continuous Infusions:  sodium chloride 150 mL/hr at 10/19/19 1037     Time spent: 28 minutes with over 50% of the time coordinating the patient's care    Jae Dire, DO [facility] Pager [phone]  Call night coverage person covering after 7pm

## 2019-10-20 LAB — COMPREHENSIVE METABOLIC PANEL
ALT: 618 U/L — ABNORMAL HIGH (ref 0–44)
AST: 671 U/L — ABNORMAL HIGH (ref 15–41)
Albumin: 3.3 g/dL — ABNORMAL LOW (ref 3.5–5.0)
Alkaline Phosphatase: 81 U/L (ref 38–126)
Anion gap: 7 (ref 5–15)
BUN: 8 mg/dL (ref 6–20)
CO2: 26 mmol/L (ref 22–32)
Calcium: 8.5 mg/dL — ABNORMAL LOW (ref 8.9–10.3)
Chloride: 102 mmol/L (ref 98–111)
Creatinine, Ser: 0.52 mg/dL (ref 0.44–1.00)
GFR calc Af Amer: >60 mL/min (ref >60)
GFR calc non Af Amer: >60 mL/min (ref >60)
Glucose, Bld: 92 mg/dL (ref 70–99)
Potassium: 4.5 mmol/L (ref 3.5–5.1)
Sodium: 135 mmol/L (ref 135–145)
Total Bilirubin: 0.6 mg/dL (ref 0.3–1.2)
Total Protein: 7 g/dL (ref 6.5–8.1)

## 2019-10-20 LAB — CK: Total CK: >50000 U/L — ABNORMAL HIGH (ref 38–234)

## 2019-10-20 NOTE — Progress Notes (Signed)
PROGRESS NOTE    Kim Farrell    Code Status: Full Code  EXN:[id number] DOB: 1995-09-23 DOA: 10/15/2019 LOS: 5 days  PCP: Lucianne Lei, MD CC:  Chief Complaint  Patient presents with  . 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  She was admitted for recurrent rhabdomyolysis and started on IV fluids.  Of note she underwent a muscle biopsy in 2017 which revealed only one small focus of lymphocytic inflammation.   LFTs continued to trend up and a right upper quadrant ultrasound was ordered on 6/24 which showed fatty liver but otherwise unremarkable   A & P   Principal Problem:   Rhabdomyolysis Active Problems:   Elevated transaminase level   1. Nontraumatic severe rhabdomyolysis of unknown etiology a. CK level still> 50 K, unknown if peaked yet overnight though I suspect that it has peaked since her urine is starting to clear and her LFTs have plateaued b. Improved lower extremity tenderness to palpation c. Extensive negative work-up in the past including: Negative dsDNA, negative complement, negative IgE to allergens, negative TSH in 2018.  Negative HIV, positive IgG for EBV but negative IgM 2018, negative sickle cell screen d. TSH unremarkable e. Renal function intact f. She will need an outpatient rheumatologic referral at discharge and likely genetics consult to evaluate for metabolic/genetic cause g. May need repeat muscle biopsy at some point h. IV fluids increase to 200 cc/h i. Continue PT  2. Asymptomatic elevated LFTs suspect secondary to rhabdomyolysis a. RUQ ultrasound-fatty liver but otherwise  unremarkable b. LFTs seem to have plateaued c. Advised on proper diet once she is discharged  3. Lower extremity pain suspect secondary to rhabdomyolysis a. PT brought up a good point this may be nerve impingement b. I would prefer to wait for CK and rhabdomyolysis to resolve prior to work-up for nerve impingement.  If symptoms are persistent despite resolution of rhabdomyolysis then would pursue neurologic work-up.  4. Obesity Body mass index is 40.23 kg/m.  DVT prophylaxis: enoxaparin (LOVENOX) injection 40 mg Start: 10/16/19 0015   Family Communication: Mother at bedside yesterday  Disposition Plan:   Status is: Inpatient  Remains inpatient appropriate because:IV treatments appropriate due to intensity of illness or inability to take PO   Dispo: The patient is from: Home              Anticipated d/c is to: Home              Anticipated d/c date is: 2 days              Patient currently is not medically stable to d/c.          Pressure injury documentation    None  Consultants  None  Procedures  None  Antibiotics   Anti-infectives (From admission, onward)   None        Subjective   States she is starting to feel a bit better today and that her urine is clearing up as it has been dark.  No other issues or overnight events.  Able to stretch her legs a bit more without cramping.  No paresthesias.  Objective   Vitals:   10/19/19 1415 10/19/19 2109 10/20/19 0527 10/20/19 1309  BP: 134/80 116/78 111/61 110/68  Pulse: 86 99 86 75  Resp: 20 17 18 16   Temp: 98.2 F (36.8 C) 98.2 F (36.8 C) 98.6 F (37 C) 98.3 F (36.8 C)  TempSrc: Oral Oral Oral Oral  SpO2: 100% 98% 96% 100%  Weight:      Height:        Intake/Output Summary (Last 24 hours) at 10/20/2019 1534 Last data filed at 10/20/2019 1519 Gross per 24 hour  Intake 4428.8 ml  Output 1900 ml  Net 2528.8 ml   Filed Weights   10/16/19 0028  Weight: 106.3 kg    Examination:  Physical  Exam Vitals and nursing note reviewed.  Constitutional:      Appearance: Normal appearance.  HENT:     Head: Normocephalic and atraumatic.  Eyes:     Conjunctiva/sclera: Conjunctivae normal.  Cardiovascular:     Rate and Rhythm: Normal rate and regular rhythm.  Pulmonary:     Effort: Pulmonary effort is normal.     Breath sounds: Normal breath sounds.  Abdominal:     General: Abdomen is flat.     Palpations: Abdomen is soft.  Skin:    Coloration: Skin is not jaundiced or pale.  Neurological:     Mental Status: She is alert. Mental status is at baseline.  Psychiatric:        Mood and Affect: Mood normal.        Behavior: Behavior normal.     Data Reviewed: I have personally reviewed following labs and imaging studies  CBC: Recent Labs  Lab 10/15/19 1958 10/16/19 0458 10/17/19 0503 10/19/19 0520  WBC 4.3 4.8 5.1 6.0  HGB 14.3 12.2 13.6 13.1  HCT 44.2 38.7 42.6 40.9  MCV 86.5 87.8 87.8 85.9  PLT 222 195 202 212   Basic Metabolic Panel: Recent Labs  Lab 10/16/19 0458 10/17/19 0503 10/18/19 0541 10/19/19 0520 10/20/19 0452  NA 139 136 135 136 135  K 3.5 4.4 4.4 4.4 4.5  CL 110 104 106 104 102  CO2 25 19* 22 23 26   GLUCOSE 88 79 79 88 92  BUN 9 6 6 8 8   CREATININE 0.63 0.52 0.48 0.46 0.52  CALCIUM 8.0* 8.2* 8.1* 8.4* 8.5*  MG 1.9  --   --  2.2  --    GFR: Estimated Creatinine Clearance: 132.2 mL/min (by C-G formula based on SCr of 0.52 mg/dL). Liver Function Tests: Recent Labs  Lab 10/16/19 0458 10/17/19 0503 10/18/19 0541 10/19/19 0520 10/20/19 0452  AST 166* 312* 500* 675* 671*  ALT 75* 150* 256* 562* 618*  ALKPHOS 57 65 72 81 81  BILITOT 0.5 0.9 0.9 0.7 0.6  PROT 6.1* 6.9 6.8 7.0 7.0  ALBUMIN 3.1* 3.5 3.3* 3.3* 3.3*   No results for input(s): LIPASE, AMYLASE in the last 168 hours. No results for input(s): AMMONIA in the last 168 hours. Coagulation Profile: No results for input(s): INR, PROTIME in the last 168 hours. Cardiac Enzymes: Recent  Labs  Lab 10/17/19 0503 10/17/19 1145 10/18/19 0541 10/19/19 0520 10/20/19 0452  CKTOTAL >50,000* >50,000* >50,000* >50,000* >50,000*   BNP (last 3 results) No results for input(s): PROBNP in the last 8760 hours. HbA1C: No results for input(s): HGBA1C in the last 72 hours. CBG: No results for input(s): GLUCAP in the last 168 hours. Lipid Profile: No results for input(s): CHOL, HDL, LDLCALC, TRIG, CHOLHDL, LDLDIRECT in the  last 72 hours. Thyroid Function Tests: Recent Labs    10/19/19 0520  TSH 3.698   Anemia Panel: No results for input(s): VITAMINB12, FOLATE, FERRITIN, TIBC, IRON, RETICCTPCT in the last 72 hours. Sepsis Labs: No results for input(s): PROCALCITON, LATICACIDVEN in the last 168 hours.  Recent Results (from the past 240 hour(s))  SARS Coronavirus 2 by RT PCR (hospital order, performed in [facility] hospital lab) Nasopharyngeal Nasopharyngeal Swab     Status: None   Collection Time: 10/15/19  9:51 PM   Specimen: Nasopharyngeal Swab  Result Value Ref Range Status   SARS Coronavirus 2 NEGATIVE NEGATIVE Final    Comment: (NOTE) SARS-CoV-2 target nucleic acids are NOT DETECTED.  The SARS-CoV-2 RNA is generally detectable in upper and lower respiratory specimens during the acute phase of infection. The lowest concentration of SARS-CoV-2 viral copies this assay can detect is 250 copies / mL. A negative result does not preclude SARS-CoV-2 infection and should not be used as the sole basis for treatment or other patient management decisions.  A negative result may occur with improper specimen collection / handling, submission of specimen other than nasopharyngeal swab, presence of viral mutation(s) within the areas targeted by this assay, and inadequate number of viral copies (<250 copies / mL). A negative result must be combined with clinical observations, patient history, and epidemiological information.  Fact Sheet for Patients:     BoilerBrush.com.cy  Fact Sheet for Healthcare Providers: https://pope.com/  This test is not yet approved or  cleared by the [place] FDA and has been authorized for detection and/or diagnosis of SARS-CoV-2 by FDA under an Emergency Use Authorization (EUA).  This EUA will remain in effect (meaning this test can be used) for the duration of the COVID-19 declaration under Section 564(b)(1) of the Act, 21 U.S.C. section 360bbb-3(b)(1), unless the authorization is terminated or revoked sooner.  Performed at [facility], [address] [place], [place] [address]          Radiology Studies: US Abdomen Limited RUQ  Result Date: 10/19/2019 CLINICAL DATA:  24 year old female with elevated LFTs. EXAM: ULTRASOUND ABDOMEN LIMITED RIGHT UPPER QUADRANT COMPARISON:  CT abdomen pelvis dated 04/04/2016. FINDINGS: Gallbladder: No gallstones or wall thickening visualized. No sonographic Murphy sign noted by sonographer. Common bile duct: Diameter: 3 mm Liver: There is diffuse increased liver echogenicity most commonly seen in the setting of fatty infiltration. Superimposed inflammation or fibrosis is not excluded. Clinical correlation is recommended. Portal vein is patent on color Doppler imaging with normal direction of blood flow towards the liver. Other: None. IMPRESSION: Fatty liver, otherwise unremarkable right upper quadrant ultrasound. Electronically Signed   By: Elgie Collard M.D.   On: 10/19/2019 19:57        Scheduled Meds: . enoxaparin (LOVENOX) injection  40 mg Subcutaneous QHS   Continuous Infusions: . sodium chloride 200 mL/hr at 10/20/19 1519     Time spent: 25 minutes with over 50% of the time coordinating the patient's care    Jae Dire, DO [facility] Pager [phone]  Call night coverage person covering after 7pm

## 2019-10-21 LAB — COMPREHENSIVE METABOLIC PANEL
ALT: 527 U/L — ABNORMAL HIGH (ref 0–44)
AST: 501 U/L — ABNORMAL HIGH (ref 15–41)
Albumin: 3.3 g/dL — ABNORMAL LOW (ref 3.5–5.0)
Alkaline Phosphatase: 85 U/L (ref 38–126)
Anion gap: 5 (ref 5–15)
BUN: 8 mg/dL (ref 6–20)
CO2: 27 mmol/L (ref 22–32)
Calcium: 8.6 mg/dL — ABNORMAL LOW (ref 8.9–10.3)
Chloride: 100 mmol/L (ref 98–111)
Creatinine, Ser: 0.59 mg/dL (ref 0.44–1.00)
GFR calc Af Amer: >60 mL/min (ref >60)
GFR calc non Af Amer: >60 mL/min (ref >60)
Glucose, Bld: 89 mg/dL (ref 70–99)
Potassium: 4.3 mmol/L (ref 3.5–5.1)
Sodium: 132 mmol/L — ABNORMAL LOW (ref 135–145)
Total Bilirubin: 0.5 mg/dL (ref 0.3–1.2)
Total Protein: 7.1 g/dL (ref 6.5–8.1)

## 2019-10-21 LAB — CK: Total CK: 39196 U/L — ABNORMAL HIGH (ref 38–234)

## 2019-10-21 NOTE — Progress Notes (Signed)
PROGRESS NOTE    Kim Farrell    Code Status: Full Code  HWE:[id number] DOB: 1995-05-28 DOA: 10/15/2019 LOS: 6 days  PCP: Lucianne Lei, MD CC:  Chief Complaint  Patient presents with  . 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  She was admitted for recurrent rhabdomyolysis and started on IV fluids.  Of note she underwent a muscle biopsy in 2017 which revealed only one small focus of lymphocytic inflammation.   LFTs continued to trend up and a right upper quadrant ultrasound was ordered on 6/24 which showed fatty liver but otherwise unremarkable   A & P   Principal Problem:   Rhabdomyolysis Active Problems:   Elevated transaminase level   1. Nontraumatic severe rhabdomyolysis of unknown etiology a. CK level trending down to 39K b. Extensive negative work-up in the past including: Negative dsDNA, negative complement, negative IgE to allergens, negative TSH in 2018.  Negative HIV, positive IgG for EBV but negative IgM 2018, negative sickle cell screen c. TSH unremarkable d. Renal function intact e. She will need an outpatient rheumatologic referral at discharge and likely genetics consult to evaluate for metabolic/genetic cause f. May need repeat muscle biopsy at some point g. Continue IV fluids 200 cc/h h. Continue PT  2. Asymptomatic elevated LFTs suspect secondary to rhabdomyolysis a. RUQ ultrasound-fatty liver but otherwise unremarkable b. LFTs seem to have plateaued c. Advised on proper diet once she is discharged  3. Lower extremity pain suspect secondary to rhabdomyolysis a. PT brought up a good  point this may be nerve impingement b. I would prefer to wait for CK and rhabdomyolysis to resolve prior to work-up for nerve impingement.  If symptoms are persistent despite resolution of rhabdomyolysis then would pursue neurologic work-up.  4. Obesity Body mass index is 40.23 kg/m.  DVT prophylaxis: enoxaparin (LOVENOX) injection 40 mg Start: 10/16/19 0015   Family Communication: called mother with no response  Disposition Plan:   Status is: Inpatient  Remains inpatient appropriate because:IV treatments appropriate due to intensity of illness or inability to take PO   Dispo: The patient is from: Home              Anticipated d/c is to: Home              Anticipated d/c date is: 2 days              Patient currently is not medically stable to d/c.          Pressure injury documentation    None  Consultants  None  Procedures  None  Antibiotics   Anti-infectives (From admission, onward)   None        Subjective   Patient seen and examined at bedside no acute distress and resting comfortably.  No events overnight.  Tolerating diet. Reports her urine is clear and normal now.  Denies any chest pain, shortness of breath, fever, nausea, vomiting. Admits to night sweats for the past several nights.  Otherwise ROS negative   Objective   Vitals:   10/20/19 0527 10/20/19 1309 10/20/19 2118 10/21/19 0606  BP: 111/61 110/68 116/70 130/81  Pulse: 86 75 88 90  Resp: 18 16 18 18   Temp: 98.6 F (37 C) 98.3 F (36.8 C) 98.9 F (37.2 C) 100.3 F (37.9 C)  TempSrc: Oral Oral Oral Oral  SpO2: 96% 100% 100% 97%  Weight:      Height:        Intake/Output Summary (Last 24 hours) at 10/21/2019 1205 Last data filed at 10/21/2019 10/23/2019 Gross per 24 hour  Intake 5611.99 ml  Output 2700 ml  Net 2911.99 ml   Filed Weights   10/16/19 0028  Weight: 106.3 kg    Examination:  Physical Exam Vitals and nursing note reviewed.  Constitutional:      Appearance:  Normal appearance.  HENT:     Head: Normocephalic and atraumatic.  Cardiovascular:     Rate and Rhythm: Normal rate and regular rhythm.  Pulmonary:     Effort: Pulmonary effort is normal.     Breath sounds: Normal breath sounds.  Abdominal:     General: Abdomen is flat.     Palpations: Abdomen is soft.  Musculoskeletal:     Comments: Tenderness to palpation of lateral left thigh  Skin:    Coloration: Skin is not jaundiced or pale.  Neurological:     Mental Status: She is alert. Mental status is at baseline.  Psychiatric:        Mood and Affect: Mood normal.        Behavior: Behavior normal.     Data Reviewed: I have personally reviewed following labs and imaging studies  CBC: Recent Labs  Lab 10/15/19 1958 10/16/19 0458 10/17/19 0503 10/19/19 0520  WBC 4.3 4.8 5.1 6.0  HGB 14.3 12.2 13.6 13.1  HCT 44.2 38.7 42.6 40.9  MCV 86.5 87.8 87.8 85.9  PLT 222 195 202 212   Basic Metabolic Panel: Recent Labs  Lab 10/16/19 0458 10/16/19 0458 10/17/19 0503 10/18/19 0541 10/19/19 0520 10/20/19 0452 10/21/19 0514  NA 139   < > 136 135 136 135 132*  K 3.5   < > 4.4 4.4 4.4 4.5 4.3  CL 110   < > 104 106 104 102 100  CO2 25   < > 19* 22 23 26 27   GLUCOSE 88   < > 79 79 88 92 89  BUN 9   < > 6 6 8 8 8   CREATININE 0.63   < > 0.52 0.48 0.46 0.52 0.59  CALCIUM 8.0*   < > 8.2* 8.1* 8.4* 8.5* 8.6*  MG 1.9  --   --   --  2.2  --   --    < > = values in this interval not displayed.   GFR: Estimated Creatinine Clearance: 132.2 mL/min (by C-G formula based on SCr of 0.59 mg/dL). Liver Function Tests: Recent Labs  Lab 10/17/19 0503 10/18/19 0541 10/19/19 0520 10/20/19 0452 10/21/19 0514  AST 312* 500* 675* 671* 501*  ALT 150* 256* 562* 618* 527*  ALKPHOS 65 72 81 81 85  BILITOT 0.9 0.9 0.7 0.6 0.5  PROT 6.9 6.8 7.0 7.0 7.1  ALBUMIN 3.5 3.3* 3.3* 3.3* 3.3*   No results for input(s): LIPASE, AMYLASE in the last 168 hours. No results for input(s): AMMONIA in the last 168  hours. Coagulation Profile: No results for input(s): INR, PROTIME in the last 168 hours. Cardiac Enzymes: Recent Labs  Lab 10/17/19 1145 10/18/19 0541 10/19/19 0520 10/20/19 0452 10/21/19 0514  CKTOTAL >50,000* >50,000* >50,000* >50,000* 39,196*   BNP (last 3 results) No  results for input(s): PROBNP in the last 8760 hours. HbA1C: No results for input(s): HGBA1C in the last 72 hours. CBG: No results for input(s): GLUCAP in the last 168 hours. Lipid Profile: No results for input(s): CHOL, HDL, LDLCALC, TRIG, CHOLHDL, LDLDIRECT in the last 72 hours. Thyroid Function Tests: Recent Labs    10/19/19 0520  TSH 3.698   Anemia Panel: No results for input(s): VITAMINB12, FOLATE, FERRITIN, TIBC, IRON, RETICCTPCT in the last 72 hours. Sepsis Labs: No results for input(s): PROCALCITON, LATICACIDVEN in the last 168 hours.  Recent Results (from the past 240 hour(s))  SARS Coronavirus 2 by RT PCR (hospital order, performed in [facility] hospital lab) Nasopharyngeal Nasopharyngeal Swab     Status: None   Collection Time: 10/15/19  9:51 PM   Specimen: Nasopharyngeal Swab  Result Value Ref Range Status   SARS Coronavirus 2 NEGATIVE NEGATIVE Final    Comment: (NOTE) SARS-CoV-2 target nucleic acids are NOT DETECTED.  The SARS-CoV-2 RNA is generally detectable in upper and lower respiratory specimens during the acute phase of infection. The lowest concentration of SARS-CoV-2 viral copies this assay can detect is 250 copies / mL. A negative result does not preclude SARS-CoV-2 infection and should not be used as the sole basis for treatment or other patient management decisions.  A negative result may occur with improper specimen collection / handling, submission of specimen other than nasopharyngeal swab, presence of viral mutation(s) within the areas targeted by this assay, and inadequate number of viral copies (<250 copies / mL). A negative result must be combined with  clinical observations, patient history, and epidemiological information.  Fact Sheet for Patients:   BoilerBrush.com.cy  Fact Sheet for Healthcare Providers: https://pope.com/  This test is not yet approved or  cleared by the [place] FDA and has been authorized for detection and/or diagnosis of SARS-CoV-2 by FDA under an Emergency Use Authorization (EUA).  This EUA will remain in effect (meaning this test can be used) for the duration of the COVID-19 declaration under Section 564(b)(1) of the Act, 21 U.S.C. section 360bbb-3(b)(1), unless the authorization is terminated or revoked sooner.  Performed at [facility], [address] [place], [place] [address]          Radiology Studies: US Abdomen Limited RUQ  Result Date: 10/19/2019 CLINICAL DATA:  24 year old female with elevated LFTs. EXAM: ULTRASOUND ABDOMEN LIMITED RIGHT UPPER QUADRANT COMPARISON:  CT abdomen pelvis dated 04/04/2016. FINDINGS: Gallbladder: No gallstones or wall thickening visualized. No sonographic Murphy sign noted by sonographer. Common bile duct: Diameter: 3 mm Liver: There is diffuse increased liver echogenicity most commonly seen in the setting of fatty infiltration. Superimposed inflammation or fibrosis is not excluded. Clinical correlation is recommended. Portal vein is patent on color Doppler imaging with normal direction of blood flow towards the liver. Other: None. IMPRESSION: Fatty liver, otherwise unremarkable right upper quadrant ultrasound. Electronically Signed   By: Elgie Collard M.D.   On: 10/19/2019 19:57        Scheduled Meds: . enoxaparin (LOVENOX) injection  40 mg Subcutaneous QHS   Continuous Infusions: . sodium chloride 200 mL/hr at 10/21/19 1020     Time spent: 20 minutes with over 50% of the time coordinating the patient's care    Jae Dire, DO [facility] Pager [phone]  Call night  coverage person covering after 7pm

## 2019-10-22 LAB — CK
Total CK: 19057 U/L — ABNORMAL HIGH (ref 38–234)
Total CK: 4999 U/L — ABNORMAL HIGH (ref 38–234)

## 2019-10-22 MED ORDER — SODIUM CHLORIDE 0.9 % IV BOLUS
500.0000 mL | Freq: Once | INTRAVENOUS | Status: AC
  Administered 2019-10-22: 500 mL via INTRAVENOUS

## 2019-10-22 NOTE — Discharge Instructions (Signed)
Rhabdomyolysis °Rhabdomyolysis is a condition that happens when muscle cells break down and release substances into the blood that can damage the kidneys. This condition happens because of damage to the muscles that move bones (skeletal muscle). When the skeletal muscles are damaged, substances inside the muscle cells go into the blood. One of these substances is a protein called myoglobin. °Large amounts of myoglobin can cause kidney damage or kidney failure. Other substances that are released by muscle cells may upset the balance of the minerals (electrolytes) in your blood. This imbalance causes your blood to have too much acid (acidosis). °What are the causes? °This condition is caused by muscle damage. Muscle damage often happens because of: °· Using your muscles too much. °· An injury that crushes or squeezes a muscle too tightly. °· Using illegal drugs, mainly cocaine. °· Alcohol abuse. °Other possible causes include: °· Prescription medicines, such as those that: °? Lower cholesterol (statins). °? Treat ADHD (attention deficit hyperactivity disorder) or help with weight loss (amphetamines). °? Treat pain (opiates). °· Infections. °· Muscle diseases that are passed down from parent to child (inherited). °· High fever. °· Heatstroke. °· Not having enough fluids in your body (dehydration). °· Seizures. °· Surgery. °What increases the risk? °This condition is more likely to develop in people who: °· Have a family history of muscle disease. °· Take part in extreme sports, such as running in marathons. °· Have diabetes. °· Are older. °· Abuse drugs or alcohol. °What are the signs or symptoms? °Symptoms of this condition vary. Some people have very few symptoms, and other people have many symptoms. The most common symptoms include: °· Muscle pain and swelling. °· Weak muscles. °· Dark urine. °· Feeling weak and tired. °Other symptoms include: °· Nausea and vomiting. °· Fever. °· Pain in the abdomen. °· Pain in the  joints. °Symptoms of complications from this condition include: °· Heart rhythm that is not normal (arrhythmia). °· Seizures. °· Not urinating enough because of kidney failure. °· Very low blood pressure (shock). Signs of shock include dizziness, blurry vision, and clammy skin. °· Bleeding that is hard to stop or control. °How is this diagnosed? °This condition is diagnosed based on your medical history, your symptoms, and a physical exam. Tests may also be done, including: °· Blood tests. °· Urine tests to check for myoglobin. °You may also have other tests to check for causes of muscle damage and to check for complications. °How is this treated? °Treatment for this condition helps to: °· Make sure you have enough fluids in your body. °· Lower the acid levels in your blood to reverse acidosis. °· Protect your kidneys. °Treatment may include: °· Fluids and medicines given through an IV tube that is inserted into one of your veins. °· Medicines to lower acidosis or to bring back the balance of the minerals in your body. °· Hemodialysis. This treatment uses an artificial kidney machine to filter your blood while you recover. You may have this if other treatments are not helping. °Follow these instructions at home: ° °· Take over-the-counter and prescription medicines only as told by your health care provider. °· Rest at home until your health care provider says that you can return to your normal activities. °· Drink enough fluid to keep your urine clear or pale yellow. °· Do not do activities that take a lot of effort (are strenuous). Ask your health care provider what level of exercise is safe for you. °· Do not abuse drugs or   alcohol. If you are having problems with drug or alcohol use, ask your health care provider for help. °· Keep all follow-up visits as told by your health care provider. This is important. °Contact a health care provider if: °· You start having symptoms of this condition after treatment. °Get  help right away if: °· You have a seizure. °· You bleed easily or cannot control bleeding. °· You cannot urinate. °· You have chest pain. °· You have trouble breathing. °This information is not intended to replace advice given to you by your health care provider. Make sure you discuss any questions you have with your health care provider. °Document Revised: 03/26/2017 Document Reviewed: 01/24/2016 °Elsevier Patient Education © 2020 [employer] ° °

## 2019-10-22 NOTE — Discharge Summary (Signed)
Physician Discharge Summary  [name] NLG:[id number] DOB: 1996-01-24   PCP: Lucianne Lei, MD  Admit date: 10/15/2019 Discharge date: 10/22/2019 Length of Stay: 7 days   Code Status: Full Code  Admitted From:  Home Discharged to:   [place]:  None  Equipment/Devices:  None Discharge Condition: Improving  Recommendations for Outpatient Follow-up   1. Follow up with PCP this week with CK and CMP 2. Ambulatory referral to PT 3. Ambulatory referral to Rheumatology 4. 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  She was admitted for recurrent rhabdomyolysis and started on IV fluids.  Of note she underwent a muscle biopsy in 2017 which revealed only one small focus of lymphocytic inflammation.   LFTs continued to trend up and a right upper quadrant ultrasound was ordered on 6/24 which showed fatty liver but otherwise unremarkable  CKD trended up to uncalculated value  > 50 K. Fortunately she never developed an AKI or compartment syndrome.  Patient required high volumes of IV fluids and had symptomatic improvement as well as significant downtrend of CK.  She was seen by PT who recommended outpatient PT.  Referred to rheumatology at discharge.   A & P   Principal Problem:   Rhabdomyolysis Active Problems:   Elevated transaminase level    1. Nontraumatic severe rhabdomyolysis of unknown etiology a. CK peaked at >50K->>> 4999 at discharge b. Extensive negative work-up in the past including: Negative dsDNA, negative complement, negative IgE to allergens, negative TSH  in 2018.  Negative HIV, positive IgG for EBV but negative IgM 2018, negative sickle cell screen c. TSH unremarkable d. Renal function intact e. Outpatient rheumatologic referral at discharge  f. May need a genetics consult to evaluate for metabolic/genetic cause g. May need repeat muscle biopsy at some point h. Outpatient PT i. Encouraged PO intake and rest j. Repeat CK level  2. Asymptomatic elevated LFTs suspect secondary to rhabdomyolysis a. RUQ ultrasound-fatty liver but otherwise unremarkable b. LFTs peaked at AST/ALT 675/618 c. Advised on proper diet  d. Repeat CMP  3. Lower extremity pain secondary to rhabdomyolysis a. PT brought up a good point this may be nerve impingement 1. Consider further workout as an outpatient   4. Obesity a. Body mass index is 40.23 kg/m.     Consultants  . none  Procedures  . none  Antibiotics   Anti-infectives (From admission, onward)   None       Subjective  Patient seen and examined at bedside no acute distress and resting comfortably.  No events overnight.  Tolerating diet. In good spirits and anticipating discharge.   Denies any chest pain, shortness of breath, fever, nausea, vomiting, urinary or bowel complaints. Otherwise ROS negative    Objective   Discharge Exam: Vitals:   10/22/19 0641 10/22/19 1412  BP: 116/82 121/76  Pulse: 88 94  Resp: 16 14  Temp: 99.5 F (37.5 C) 98.3 F (36.8 C)  SpO2: 99% 98%   Vitals:   10/21/19 1508 10/21/19 2057 10/22/19 0641 10/22/19 1412  BP: 112/74 127/79 116/82 121/76  Pulse: 87 99 88 94  Resp: 17 18 16  14  Temp: 98.6 F (37 C) 99 F (37.2 C) 99.5 F (37.5 C) 98.3 F (36.8 C)  TempSrc: Oral  Oral Oral  SpO2: 100% 100% 99% 98%  Weight:      Height:        Physical Exam Vitals and nursing note reviewed.  Constitutional:      Appearance: Normal appearance.  HENT:     Head: Normocephalic and atraumatic.  Eyes:     Conjunctiva/sclera: Conjunctivae normal.   Cardiovascular:     Rate and Rhythm: Normal rate and regular rhythm.  Pulmonary:     Effort: Pulmonary effort is normal.     Breath sounds: Normal breath sounds.  Abdominal:     General: Abdomen is flat.     Palpations: Abdomen is soft.  Musculoskeletal:        General: No swelling or tenderness.  Skin:    Coloration: Skin is not jaundiced or pale.  Neurological:     Mental Status: She is alert. Mental status is at baseline.  Psychiatric:        Mood and Affect: Mood normal.        Behavior: Behavior normal.       The results of significant diagnostics from this hospitalization (including imaging, microbiology, ancillary and laboratory) are listed below for reference.     Microbiology: Recent Results (from the past 240 hour(s))  SARS Coronavirus 2 by RT PCR (hospital order, performed in [facility] hospital lab) Nasopharyngeal Nasopharyngeal Swab     Status: None   Collection Time: 10/15/19  9:51 PM   Specimen: Nasopharyngeal Swab  Result Value Ref Range Status   SARS Coronavirus 2 NEGATIVE NEGATIVE Final    Comment: (NOTE) SARS-CoV-2 target nucleic acids are NOT DETECTED.  The SARS-CoV-2 RNA is generally detectable in upper and lower respiratory specimens during the acute phase of infection. The lowest concentration of SARS-CoV-2 viral copies this assay can detect is 250 copies / mL. A negative result does not preclude SARS-CoV-2 infection and should not be used as the sole basis for treatment or other patient management decisions.  A negative result may occur with improper specimen collection / handling, submission of specimen other than nasopharyngeal swab, presence of viral mutation(s) within the areas targeted by this assay, and inadequate number of viral copies (<250 copies / mL). A negative result must be combined with clinical observations, patient history, and epidemiological information.  Fact Sheet for Patients:    BoilerBrush.com.cy  Fact Sheet for Healthcare Providers: https://pope.com/  This test is not yet approved or  cleared by the [place] FDA and has been authorized for detection and/or diagnosis of SARS-CoV-2 by FDA under an Emergency Use Authorization (EUA).  This EUA will remain in effect (meaning this test can be used) for the duration of the COVID-19 declaration under Section 564(b)(1) of the Act, 21 U.S.C. section 360bbb-3(b)(1), unless the authorization is terminated or revoked sooner.  Performed at [facility], [address], [place], [place] [address]      Labs: BNP (last 3 results) No results for input(s): BNP in the last 8760 hours. Basic Metabolic Panel: Recent Labs  Lab 10/16/19 0458 10/16/19 0458 10/17/19 0503 10/18/19 0541 10/19/19 0520 10/20/19 0452 10/21/19 0514  NA 139   < > 136 135 136 135 132*  K 3.5   < > 4.4 4.4 4.4 4.5 4.3  CL 110   < > 104 106 104 102 100  CO2 25   < > 19* 22 23  26 27  GLUCOSE 88   < > 79 79 88 92 89  BUN 9   < > 6 6 8 8 8   CREATININE 0.63   < > 0.52 0.48 0.46 0.52 0.59  CALCIUM 8.0*   < > 8.2* 8.1* 8.4* 8.5* 8.6*  MG 1.9  --   --   --  2.2  --   --    < > = values in this interval not displayed.   Liver Function Tests: Recent Labs  Lab 10/17/19 0503 10/18/19 0541 10/19/19 0520 10/20/19 0452 10/21/19 0514  AST 312* 500* 675* 671* 501*  ALT 150* 256* 562* 618* 527*  ALKPHOS 65 72 81 81 85  BILITOT 0.9 0.9 0.7 0.6 0.5  PROT 6.9 6.8 7.0 7.0 7.1  ALBUMIN 3.5 3.3* 3.3* 3.3* 3.3*   No results for input(s): LIPASE, AMYLASE in the last 168 hours. No results for input(s): AMMONIA in the last 168 hours. CBC: Recent Labs  Lab 10/15/19 1958 10/16/19 0458 10/17/19 0503 10/19/19 0520  WBC 4.3 4.8 5.1 6.0  HGB 14.3 12.2 13.6 13.1  HCT 44.2 38.7 42.6 40.9  MCV 86.5 87.8 87.8 85.9  PLT 222 195 202 212   Cardiac Enzymes: Recent Labs  Lab  10/19/19 0520 10/20/19 0452 10/21/19 0514 10/22/19 0538 10/22/19 1533  CKTOTAL >50,000* >50,000* 39,196* 19,057* 4,999*   BNP: Invalid input(s): POCBNP CBG: No results for input(s): GLUCAP in the last 168 hours. D-Dimer No results for input(s): DDIMER in the last 72 hours. Hgb A1c No results for input(s): HGBA1C in the last 72 hours. Lipid Profile No results for input(s): CHOL, HDL, LDLCALC, TRIG, CHOLHDL, LDLDIRECT in the last 72 hours. Thyroid function studies No results for input(s): TSH, T4TOTAL, T3FREE, THYROIDAB in the last 72 hours.  Invalid input(s): FREET3 Anemia work up No results for input(s): VITAMINB12, FOLATE, FERRITIN, TIBC, IRON, RETICCTPCT in the last 72 hours. Urinalysis    Component Value Date/Time   COLORURINE YELLOW 10/15/2019 2236   APPEARANCEUR HAZY (A) 10/15/2019 2236   LABSPEC 1.023 10/15/2019 2236   PHURINE 6.0 10/15/2019 2236   GLUCOSEU NEGATIVE 10/15/2019 2236   HGBUR NEGATIVE 10/15/2019 2236   BILIRUBINUR NEGATIVE 10/15/2019 2236   KETONESUR NEGATIVE 10/15/2019 2236   PROTEINUR NEGATIVE 10/15/2019 2236   NITRITE NEGATIVE 10/15/2019 2236   LEUKOCYTESUR NEGATIVE 10/15/2019 2236   Sepsis Labs Invalid input(s): PROCALCITONIN,  WBC,  LACTICIDVEN Microbiology Recent Results (from the past 240 hour(s))  SARS Coronavirus 2 by RT PCR (hospital order, performed in [facility] hospital lab) Nasopharyngeal Nasopharyngeal Swab     Status: None   Collection Time: 10/15/19  9:51 PM   Specimen: Nasopharyngeal Swab  Result Value Ref Range Status   SARS Coronavirus 2 NEGATIVE NEGATIVE Final    Comment: (NOTE) SARS-CoV-2 target nucleic acids are NOT DETECTED.  The SARS-CoV-2 RNA is generally detectable in upper and lower respiratory specimens during the acute phase of infection. The lowest concentration of SARS-CoV-2 viral copies this assay can detect is 250 copies / mL. A negative result does not preclude SARS-CoV-2 infection and should not be used as  the sole basis for treatment or other patient management decisions.  A negative result may occur with improper specimen collection / handling, submission of specimen other than nasopharyngeal swab, presence of viral mutation(s) within the areas targeted by this assay, and inadequate number of viral copies (<250 copies / mL). A negative result must be combined with clinical observations, patient history, and epidemiological information.  Fact  Sheet for Patients:   BoilerBrush.com.cy  Fact Sheet for Healthcare Providers: https://pope.com/  This test is not yet approved or  cleared by the [place] FDA and has been authorized for detection and/or diagnosis of SARS-CoV-2 by FDA under an Emergency Use Authorization (EUA).  This EUA will remain in effect (meaning this test can be used) for the duration of the COVID-19 declaration under Section 564(b)(1) of the Act, 21 U.S.C. section 360bbb-3(b)(1), unless the authorization is terminated or revoked sooner.  Performed at [facility], [address] [place], [place] [address]     Discharge Instructions     Discharge Instructions    Ambulatory referral to Physical Therapy   Complete by: As directed    Ambulatory referral to Rheumatology   Complete by: As directed    Diet - low sodium heart healthy   Complete by: As directed    Discharge instructions   Complete by: As directed    - get lab work this week and follow up with your primary care physician - drink plenty of fluids throughout the day - do not over exert yourself - continue to rest for the next two - three days - Do not lift any heavy objects - you are being referred to physical therapy -You are being referred to rheumatology If you have any significant change or worsening of your symptoms, do not hesitate to contact your primary care physician or return to the ED.   Increase activity slowly    Complete by: As directed      Allergies as of 10/22/2019      Reactions   Cantaloupe (diagnostic) Swelling   lips   Other Other (See Comments)   Cardic DM (a cough medicine) turns red   Scallops [shellfish Allergy] Swelling   Lip swelling      Medication List    TAKE these medications   EPINEPHrine 0.3 mg/0.3 mL Soaj injection Commonly known as: EPI-PEN Inject 0.3 mLs (0.3 mg total) into the muscle once.   ibuprofen 200 MG tablet Commonly known as: ADVIL Take 400 mg by mouth every 6 (six) hours as needed for fever or headache. What changed: Another medication with the same name was removed. Continue taking this medication, and follow the directions you see here.   sucralfate 1 GM/10ML suspension Commonly known as: Carafate Take 10 mLs (1 g total) by mouth 4 (four) times daily -  with meals and at bedtime.       Allergies  Allergen Reactions  . Cantaloupe (Diagnostic) Swelling    lips  . Other Other (See Comments)    Cardic DM (a cough medicine) turns red   . Scallops [Shellfish Allergy] Swelling    Lip swelling    Dispo: The patient is from: Home              Anticipated d/c is to: Home              Anticipated d/c date is: today              Patient currently is medically stable to d/c.       Time coordinating discharge: Over 30 minutes   SIGNED:   Jae Dire, D.O. [facility] Pager: [phone]  10/22/2019, 6:34 PM

## 2019-10-22 NOTE — Progress Notes (Signed)
Pt was discharged accompanied by mother.

## 2019-10-23 ENCOUNTER — Telehealth: Payer: Self-pay

## 2019-10-23 NOTE — Telephone Encounter (Signed)
Provided Joni Reining, SW information about the [facility] mobile unit for patient to be seen for follow up

## 2019-10-24 ENCOUNTER — Ambulatory Visit: Payer: PRIVATE HEALTH INSURANCE | Admitting: Physician Assistant

## 2019-10-24 VITALS — BP 130/73 | HR 68 | Temp 98.6°F | Resp 18 | Ht 64.0 in | Wt 217.0 lb

## 2019-10-24 DIAGNOSIS — R748 Abnormal levels of other serum enzymes: Secondary | ICD-10-CM

## 2019-10-24 DIAGNOSIS — M6282 Rhabdomyolysis: Secondary | ICD-10-CM

## 2019-10-24 DIAGNOSIS — Z91013 Allergy to seafood: Secondary | ICD-10-CM

## 2019-10-24 DIAGNOSIS — R7401 Elevation of levels of liver transaminase levels: Secondary | ICD-10-CM | POA: Diagnosis not present

## 2019-10-24 MED ORDER — EPINEPHRINE 0.3 MG/0.3ML IJ SOAJ: 0.3000 mg | Freq: Once | INTRAMUSCULAR | 0 refills | Status: AC

## 2019-10-24 NOTE — Patient Instructions (Signed)
Please let us know if there is anything else we can do for you  Roney Jaffe, PA-C Physician Assistant [facility] [email or web address]       Rhabdomyolysis Rhabdomyolysis is a condition that happens when muscle cells break down and release substances into the blood that can damage the kidneys. This condition happens because of damage to the muscles that move bones (skeletal muscle). When the skeletal muscles are damaged, substances inside the muscle cells go into the blood. One of these substances is a protein called myoglobin. Large amounts of myoglobin can cause kidney damage or kidney failure. Other substances that are released by muscle cells may upset the balance of the minerals (electrolytes) in your blood. This imbalance causes your blood to have too much acid (acidosis). What are the causes? This condition is caused by muscle damage. Muscle damage often happens because of:  Using your muscles too much.  An injury that crushes or squeezes a muscle too tightly.  Using illegal drugs, mainly cocaine.  Alcohol abuse. Other possible causes include:  Prescription medicines, such as those that: ? Lower cholesterol (statins). ? Treat ADHD (attention deficit hyperactivity disorder) or help with weight loss (amphetamines). ? Treat pain (opiates).  Infections.  Muscle diseases that are passed down from parent to child (inherited).  High fever.  Heatstroke.  Not having enough fluids in your body (dehydration).  Seizures.  Surgery. What increases the risk? This condition is more likely to develop in people who:  Have a family history of muscle disease.  Take part in extreme sports, such as running in marathons.  Have diabetes.  Are older.  Abuse drugs or alcohol. What are the signs or symptoms? Symptoms of this condition vary. Some people have very few symptoms, and other people have many symptoms. The most  common symptoms include:  Muscle pain and swelling.  Weak muscles.  Dark urine.  Feeling weak and tired. Other symptoms include:  Nausea and vomiting.  Fever.  Pain in the abdomen.  Pain in the joints. Symptoms of complications from this condition include:  Heart rhythm that is not normal (arrhythmia).  Seizures.  Not urinating enough because of kidney failure.  Very low blood pressure (shock). Signs of shock include dizziness, blurry vision, and clammy skin.  Bleeding that is hard to stop or control. How is this diagnosed? This condition is diagnosed based on your medical history, your symptoms, and a physical exam. Tests may also be done, including:  Blood tests.  Urine tests to check for myoglobin. You may also have other tests to check for causes of muscle damage and to check for complications. How is this treated? Treatment for this condition helps to:  Make sure you have enough fluids in your body.  Lower the acid levels in your blood to reverse acidosis.  Protect your kidneys. Treatment may include:  Fluids and medicines given through an IV tube that is inserted into one of your veins.  Medicines to lower acidosis or to bring back the balance of the minerals in your body.  Hemodialysis. This treatment uses an artificial kidney machine to filter your blood while you recover. You may have this if other treatments are not helping. Follow these instructions at home:   Take over-the-counter and prescription medicines only as told by your health care provider.  Rest at home until your health care provider says that you can return to your normal activities.  Drink enough fluid to keep your urine clear or pale yellow.  Do not do activities that take a lot of effort (are strenuous). Ask your health care provider what level of exercise is safe for you.  Do not abuse drugs or alcohol. If you are having problems with drug or alcohol use, ask your health care  provider for help.  Keep all follow-up visits as told by your health care provider. This is important. Contact a health care provider if:  You start having symptoms of this condition after treatment. Get help right away if:  You have a seizure.  You bleed easily or cannot control bleeding.  You cannot urinate.  You have chest pain.  You have trouble breathing. This information is not intended to replace advice given to you by your health care provider. Make sure you discuss any questions you have with your health care provider. Document Revised: 03/26/2017 Document Reviewed: 01/24/2016 Elsevier Patient Education  2020 [employer].

## 2019-10-24 NOTE — Progress Notes (Signed)
New Patient Office Visit  Subjective:  Patient ID: Kim Farrell, female    DOB: 16-Jun-1995  Age: 24 y.o. MRN: [id number]  CC:  Chief Complaint  Patient presents with   Hospitalization Follow-up    HPI Kim Farrell was hospitalized from 6/20- 6/27, 2021. Hospital course:  Recommendations for Outpatient Follow-up   1. Follow up with PCP this week with CK and CMP 2. Ambulatory referral to PT 3. Ambulatory referral to Rheumatology 4. 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 She was admitted for recurrent rhabdomyolysis and started on IV fluids. Of note she underwent a muscle biopsy in 2017 which revealed only one small focus of lymphocytic inflammation.  LFTs continued to trend up and a right upper quadrant ultrasound was ordered on 6/24 which showed fatty liver but otherwise unremarkable  CKD trended up to uncalculated value  > 50 K. Fortunately she never developed an AKI or compartment syndrome.  Patient required high volumes of IV fluids and had symptomatic improvement as well as significant downtrend of CK.  She was seen by PT who recommended outpatient PT.  Referred to rheumatology at discharge.   A & P   Principal Problem:   Rhabdomyolysis Active Problems:   Elevated transaminase level    1. Nontraumatic severe rhabdomyolysis of unknown etiology a. CK peaked at >50K->>> 4999 at discharge b. Extensive negative work-up in the past including: Negative dsDNA, negative complement, negative IgE to allergens, negative TSH in 2018. Negative HIV,  positive IgG for EBV but negative IgM 2018, negative sickle cell screen c. TSH unremarkable d. Renal function intact e. Outpatient rheumatologic referral at discharge  f. May need a genetics consult to evaluate for metabolic/genetic cause g. May need repeat muscle biopsy at some point h. Outpatient PT i. Encouraged PO intake and rest j. Repeat CK level  2. Asymptomatic elevated LFTs suspect secondary to rhabdomyolysis a. RUQ ultrasound-fatty liver but otherwise unremarkable b. LFTs peaked at AST/ALT 675/618 c. Advised on proper diet  d. Repeat CMP  3. Lower extremity pain secondary to rhabdomyolysis a. PT brought up a good point this may be nerve impingement 1. Consider further workout as an outpatient   4. Obesity a. Body mass index is 40.23 kg/m.    States that she has been feeling much better since she has been home.  States that she is drinking more water, is still having some leg pain while walking down stairs. No new concerns.   Past Medical History:  Diagnosis Date   Allergy    pollen, scallops, cantelope,    Eczema    Obesity    PCOS (polycystic ovarian syndrome) 2017   pt self reported as reason for being on metformin   Pre-diabetes    Rhabdomyolysis    Rhabdomyolysis 03/2016    Past Surgical History:  Procedure Laterality Date   ADENOIDECTOMY  2015   MUSCLE BIOPSY Right 04/14/2016   Procedure: MUSCLE BIOPSY;  Surgeon: Berna Bue, MD;  Location: [facility] OR;  Service: General;  Laterality: Right;   TONSILLECTOMY      Family History  Problem Relation Age of Onset   Allergic  rhinitis Maternal Aunt    Angioedema Mother        only lip for 1 year in 2011 with hives   Hypertension Mother    Hyperlipidemia Mother    Schizophrenia Maternal Uncle    Diabetes Maternal Grandmother    Hypertension Maternal Grandmother    Asthma Neg Hx    Atopy Neg Hx    Eczema Neg Hx    Immunodeficiency Neg Hx    Urticaria Neg Hx    Thyroid  disease Neg Hx     Social History   Socioeconomic History   Marital status: Single    Spouse name: Not on file   Number of children: Not on file   Years of education: Not on file   Highest education level: Not on file  Occupational History   Not on file  Tobacco Use   Smoking status: Never Smoker   Smokeless tobacco: Never Used  Substance and Sexual Activity   Alcohol use: No   Drug use: No   Sexual activity: Yes    Birth control/protection: Condom  Other Topics Concern   Not on file  Social History Narrative   Pt lives at home with mom, grandmother, cousin, and maternal uncle. No pets.    Social Determinants of Health   Financial Resource Strain:    Difficulty of Paying Living Expenses:   Food Insecurity:    Worried About Programme researcher, broadcasting/film/video in the Last Year:    Barista in the Last Year:   Transportation Needs:    Freight forwarder (Medical):    Lack of Transportation (Non-Medical):   Physical Activity:    Days of Exercise per Week:    Minutes of Exercise per Session:   Stress:    Feeling of Stress :   Social Connections:    Frequency of Communication with Friends and Family:    Frequency of Social Gatherings with Friends and Family:    Attends Religious Services:    Active Member of Clubs or Organizations:    Attends Engineer, structural:    Marital Status:   Intimate Partner Violence:    Fear of Current or Ex-Partner:    Emotionally Abused:    Physically Abused:    Sexually Abused:     ROS Review of Systems  Constitutional: Negative.   HENT: Negative.   Eyes: Negative.   Respiratory: Negative.   Cardiovascular: Negative.   Gastrointestinal: Negative.   Endocrine: Negative.   Genitourinary: Negative for difficulty urinating, dysuria and hematuria.  Musculoskeletal: Negative.   Skin: Negative.   Allergic/Immunologic: Positive for food allergies.  Neurological: Negative.   Hematological: Negative.    Psychiatric/Behavioral: Negative.     Objective:   Today's Vitals: BP 130/73 (BP Location: Left Arm, Patient Position: Sitting, Cuff Size: Large)    Pulse 68    Temp 98.6 F (37 C) (Oral)    Resp 18    Ht 5\' 4"  (1.626 m)    Wt 217 lb (98.4 kg)    SpO2 95%    BMI 37.25 kg/m   Physical Exam Vitals and nursing note reviewed.  Constitutional:      Appearance: Normal appearance.  HENT:     Head: Normocephalic and atraumatic.     Right Ear: External ear normal.     Left Ear: External ear normal.     Nose: Nose normal.     Mouth/Throat:     Mouth: Mucous membranes are moist.  Pharynx: Oropharynx is clear.  Eyes:     Extraocular Movements: Extraocular movements intact.     Conjunctiva/sclera: Conjunctivae normal.     Pupils: Pupils are equal, round, and reactive to light.  Cardiovascular:     Rate and Rhythm: Normal rate and regular rhythm.     Pulses: Normal pulses.     Heart sounds: Normal heart sounds.  Abdominal:     General: Abdomen is flat. Bowel sounds are normal.     Palpations: Abdomen is soft.  Musculoskeletal:        General: Normal range of motion.     Cervical back: Normal range of motion and neck supple.  Skin:    General: Skin is warm and dry.  Neurological:     General: No focal deficit present.     Mental Status: She is alert and oriented to person, place, and time.  Psychiatric:        Mood and Affect: Mood normal.        Behavior: Behavior normal.        Thought Content: Thought content normal.        Judgment: Judgment normal.     Assessment & Plan:   Problem List Items Addressed This Visit      Musculoskeletal and Integument   Non-traumatic rhabdomyolysis - Primary   Relevant Orders   CK   Comprehensive metabolic panel   Ambulatory referral to Rheumatology      Outpatient Encounter Medications as of 10/24/2019  Medication Sig   ibuprofen (ADVIL) 200 MG tablet Take 400 mg by mouth every 6 (six) hours as needed for fever or headache.     EPINEPHrine 0.3 mg/0.3 mL IJ SOAJ injection Inject 0.3 mLs (0.3 mg total) into the muscle once. (Patient not taking: Reported on 10/15/2019)   sucralfate (CARAFATE) 1 GM/10ML suspension Take 10 mLs (1 g total) by mouth 4 (four) times daily -  with meals and at bedtime. (Patient not taking: Reported on 03/21/2017)   No facility-administered encounter medications on file as of 10/24/2019.   1. Non-traumatic rhabdomyolysis Patient was given appt to establish care with Dr. Earlene Plater on 12/05/19. Encouraged to continue working on proper hydration - CK - Comprehensive metabolic panel - Ambulatory referral to Rheumatology   Epipen refilled for patient   I have reviewed the patient's medical history (PMH, PSH, Social History, Family History, Medications, and allergies) , and have been updated if relevant. I spent 30 minutes reviewing chart and  face to face time with patient.     Follow-up: Return in about 6 weeks (around 12/05/2019) for To establish PCP, with Dr. Earlene Plater at [facility].   Kasandra Knudsen Mayers, PA-C

## 2019-10-25 ENCOUNTER — Telehealth: Payer: Self-pay | Admitting: *Deleted

## 2019-10-25 LAB — COMPREHENSIVE METABOLIC PANEL
ALT: 338 IU/L — ABNORMAL HIGH (ref 0–32)
AST: 197 IU/L — ABNORMAL HIGH (ref 0–40)
Albumin/Globulin Ratio: 1.1 — ABNORMAL LOW (ref 1.2–2.2)
Albumin: 3.7 g/dL — ABNORMAL LOW (ref 3.9–5.0)
Alkaline Phosphatase: 115 IU/L (ref 48–121)
BUN/Creatinine Ratio: 16 (ref 9–23)
BUN: 12 mg/dL (ref 6–20)
Bilirubin Total: 0.4 mg/dL (ref 0.0–1.2)
CO2: 21 mmol/L (ref 20–29)
Calcium: 9.3 mg/dL (ref 8.7–10.2)
Chloride: 103 mmol/L (ref 96–106)
Creatinine, Ser: 0.77 mg/dL (ref 0.57–1.00)
GFR calc Af Amer: 128 mL/min/{1.73_m2} (ref >59)
GFR calc non Af Amer: 111 mL/min/{1.73_m2} (ref >59)
Globulin, Total: 3.5 g/dL (ref 1.5–4.5)
Glucose: 90 mg/dL (ref 65–99)
Potassium: 3.9 mmol/L (ref 3.5–5.2)
Sodium: 137 mmol/L (ref 134–144)
Total Protein: 7.2 g/dL (ref 6.0–8.5)

## 2019-10-25 LAB — CK: Total CK: 6715 U/L (ref 32–182)

## 2019-10-25 NOTE — Telephone Encounter (Signed)
-----   Message from Roney Jaffe, [place] sent at 10/25/2019  9:37 AM EDT ----- Please call patient and let her know that we are sending her to neurology instead of rheumatology based on rheumatology recommendation. Her CK did trend up, but her liver function is trending down.  Continue hydration.  Please have her return to mobile unit at the beginning of next week to repeat labs, UNLESS she is able to be seen by neurology at the beginning of the week.  Thanks

## 2019-10-25 NOTE — Addendum Note (Signed)
Addended by: Roney Jaffe on: 10/25/2019 09:37 AM   Modules accepted: Orders

## 2019-10-25 NOTE — Telephone Encounter (Signed)
Patient verified DOB Patient is aware of CK trending up and to keep appointment on tomorrow with neurology for further evaluation. Patient shares she is staying hydrated and MA encouraged 4oz every hr.  Patient will FU with mobile in a few weeks basked on neurology recommendation.

## 2019-10-26 ENCOUNTER — Ambulatory Visit (INDEPENDENT_AMBULATORY_CARE_PROVIDER_SITE_OTHER): Payer: PRIVATE HEALTH INSURANCE | Admitting: Neurology

## 2019-10-26 ENCOUNTER — Other Ambulatory Visit: Payer: Self-pay

## 2019-10-26 ENCOUNTER — Encounter: Payer: Self-pay | Admitting: Neurology

## 2019-10-26 VITALS — BP 122/80 | HR 102 | Ht 64.0 in | Wt 219.3 lb

## 2019-10-26 DIAGNOSIS — M6282 Rhabdomyolysis: Secondary | ICD-10-CM | POA: Diagnosis not present

## 2019-10-26 DIAGNOSIS — B9789 Other viral agents as the cause of diseases classified elsewhere: Secondary | ICD-10-CM | POA: Diagnosis not present

## 2019-10-26 DIAGNOSIS — M60009 Infective myositis, unspecified site: Secondary | ICD-10-CM | POA: Diagnosis not present

## 2019-10-26 DIAGNOSIS — R748 Abnormal levels of other serum enzymes: Secondary | ICD-10-CM | POA: Diagnosis not present

## 2019-10-26 NOTE — Progress Notes (Signed)
Subjective:    Patient ID: Tavaria Mackins is a 24 y.o. female.  HPI     Huston Foley, MD, PhD [facility] [address], [address] [address] [place], [place] [address] Dear Rulon Eisenmenger,  I saw your patient, Magali Bray, upon your kind request, in my Neurologic clinic today for initial consultation of her rhabdomyolysis, highly elevated CK levels and recent hospitalization.  The patient is accompanied by her mother today.  As you know, Ms. Ytuarte is a 24 year old right-handed woman with an underlying medical history of PCOS, obesity, allergic rhinitis, eczema, and prediabetes, who reports intermittent issues with muscle breakdown.  Sometimes she has a trigger of physical activity.  Her warning sign may be aching in her muscles particularly in her posterior thigh, in the hamstring areas and gluteal areas.  When she first started having a problem in 2017 she had severe pain associated with this, did not know what was happening and mom recalls that she was screaming in pain.  She had a very high level of CK over 50,000 at the time, was diagnosed with viral myositis based on a muscle biopsy as I understand.  Her CK level from 10/24/2019 was still elevated at 6715, prior to that it was 4999 but had come down from 19,057.  In the past, in 2017 she had values over 50,000.  She has not seen a neurologist.  She has no family history of muscle disease, was developmentally normal, per mom, no delay in walking or talking, denies any problems on a day-to-day basis.  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  She tries to be mindful as far as her lifting but admits that sometimes she does not lift by bending her knees and more so from the back.  She denies any recent falls or injuries are over exercising, does not run or lift weights.  She denies any pain or numbness, no visual  symptoms, in particular, no blurry vision or double vision or droopy face or droopy eyelids. No problems swallowing. Weight has been stable. She does not smoke or drink alcohol or use illicit drugs. Mom states that there is a family history of epilepsy and sickle cell trait, mom has sickle cell trait but she does not.  Not much is known about the family history on dad side.  Mom reports that patient's father is in [place] and he does not have any neurological disease.  Her Past Medical History Is Significant For: Past Medical History:  Diagnosis Date  . Allergy    pollen, scallops, cantelope,   . Eczema   . Obesity   . PCOS (polycystic ovarian syndrome) 2017   pt self reported as reason for being on metformin  . Pre-diabetes   . Rhabdomyolysis   . Rhabdomyolysis 03/2016    Her Past Surgical History Is Significant For: Past Surgical History:  Procedure Laterality Date  . ADENOIDECTOMY  2015  . MUSCLE BIOPSY Right 04/14/2016   Procedure: MUSCLE BIOPSY;  Surgeon: Berna Bue, MD;  Location: [facility] OR;  Service: General;  Laterality: Right;  . TONSILLECTOMY      Her Family History Is Significant For: Family History  Problem Relation Age of Onset  . Allergic rhinitis Maternal Aunt   . Angioedema Mother        only lip for 1 year in 2011  with hives  . Hypertension Mother   . Hyperlipidemia Mother   . Schizophrenia Maternal Uncle   . Diabetes Maternal Grandmother   . Hypertension Maternal Grandmother   . Asthma Neg Hx   . Atopy Neg Hx   . Eczema Neg Hx   . Immunodeficiency Neg Hx   . Urticaria Neg Hx   . Thyroid disease Neg Hx     Her Social History Is Significant For: Social History   Socioeconomic History  . Marital status: Single    Spouse name: Not on file  . Number of children: Not on file  . Years of education: Not on file  . Highest education level: Not on file  Occupational History  . Not on file  Tobacco Use  . Smoking status: Never Smoker  . Smokeless  tobacco: Never Used  Substance and Sexual Activity  . Alcohol use: No  . Drug use: No  . Sexual activity: Yes    Birth control/protection: Condom  Other Topics Concern  . Not on file  Social History Narrative   Pt lives at home with mom, grandmother, cousin, and maternal uncle. No pets.    Social Determinants of Health   Financial Resource Strain:   . Difficulty of Paying Living Expenses:   Food Insecurity:   . Worried About Programme researcher, broadcasting/film/video in the Last Year:   . Barista in the Last Year:   Transportation Needs:   . Freight forwarder (Medical):   Marland Kitchen Lack of Transportation (Non-Medical):   Physical Activity:   . Days of Exercise per Week:   . Minutes of Exercise per Session:   Stress:   . Feeling of Stress :   Social Connections:   . Frequency of Communication with Friends and Family:   . Frequency of Social Gatherings with Friends and Family:   . Attends Religious Services:   . Active Member of Clubs or Organizations:   . Attends Banker Meetings:   Marland Kitchen Marital Status:     Her Allergies Are:  Allergies  Allergen Reactions  . Cantaloupe (Diagnostic) Swelling    lips  . Other Other (See Comments)    Cardic DM (a cough medicine) turns red   . Scallops [Shellfish Allergy] Swelling    Lip swelling  :   Her Current Medications Are:  Outpatient Encounter Medications as of 10/26/2019  Medication Sig  . ibuprofen (ADVIL) 200 MG tablet Take 400 mg by mouth every 6 (six) hours as needed for fever or headache.   . [DISCONTINUED] sucralfate (CARAFATE) 1 GM/10ML suspension Take 10 mLs (1 g total) by mouth 4 (four) times daily -  with meals and at bedtime. (Patient not taking: Reported on 03/21/2017)   No facility-administered encounter medications on file as of 10/26/2019.  :   Review of Systems:  Out of a complete 14 point review of systems, all are reviewed and negative with the exception of these symptoms as listed below:  Review of Systems   Neurological:       Here for consult on Rhabdomylosis- Pt is not established with PCP. Mom reports this has been an on going issues and no triggers have been noted.     Objective:  Neurological Exam  Physical Exam Physical Examination:   Vitals:   10/26/19 0817  BP: 122/80  Pulse: (!) 102  SpO2: 97%    General Examination: The patient is a very pleasant 24 y.o. female in no acute distress. She appears  well-developed and well-nourished and well groomed.   HEENT: Normocephalic, atraumatic, pupils are equal, round and reactive to light and accommodation. Funduscopic exam is normal with sharp disc margins noted. Extraocular tracking is good without limitation to gaze excursion or nystagmus noted. Normal smooth pursuit is noted. Hearing is grossly intact. Face is symmetric with normal facial animation and normal facial sensation. Speech is clear with no dysarthria noted. There is no hypophonia. There is no lip, neck/head, jaw or voice tremor. Neck is supple with full range of passive and active motion. There are no carotid bruits on auscultation. Oropharynx exam reveals: no signif. mouth dryness, adequate dental hygiene. Tongue protrudes centrally and palate elevates symmetrically.   Chest: Clear to auscultation without wheezing, rhonchi or crackles noted.  Heart: S1+S2+0, regular and normal without murmurs, rubs or gallops noted.   Abdomen: Soft, non-tender and non-distended with normal bowel sounds appreciated on auscultation.  Extremities: There is no pitting edema in the distal lower extremities bilaterally. Pedal pulses are intact.  Skin: Warm and dry without trophic changes noted.  Musculoskeletal: exam reveals no obvious joint deformities, tenderness or joint swelling or erythema.   Neurologically:  Mental status: The patient is awake, alert and oriented in all 4 spheres. Her immediate and remote memory, attention, language skills and fund of knowledge are appropriate. There is  no evidence of aphasia, agnosia, apraxia or anomia. Speech is clear with normal prosody and enunciation. Thought process is linear. Mood is normal and affect is normal.  Cranial nerves II - XII are as described above under HEENT exam. In addition: shoulder shrug is normal with equal shoulder height noted. Motor exam: Normal bulk, strength and tone is noted.  No tenderness in her muscles upon palpation.  Reports no back pain or pain in the back of her thighs.   There is no drift, tremor or rebound. Romberg is negative. Reflexes are 2+ throughout. Babinski: Toes are flexor bilaterally. Fine motor skills and coordination: intact with normal finger taps, normal hand movements, normal rapid alternating patting, normal foot taps and normal foot agility.  Cerebellar testing: No dysmetria or intention tremor on finger to nose testing. Heel to shin is unremarkable bilaterally. There is no truncal or gait ataxia.  Sensory exam: intact to light touch, pinprick, vibration, temperature sense in the upper and lower extremities.  Gait, station and balance: She stands easily. No veering to one side is noted. No leaning to one side is noted. Posture is age-appropriate and stance is narrow based. Gait shows normal stride length and normal pace. No problems turning are noted. Tandem walk is unremarkable.           Assessment and Plan:   In summary, Olayinka Iyanni Hepp is a very pleasant 23 y.o.-year old female with an underlying medical history of PCOS, obesity, allergic rhinitis, eczema, and prediabetes, who presents for evaluation of severe rhabdomyolysis and severely elevated CK levels recently.  No definitive diagnosis was made in 2017 when she first was noted to have a problem like this.  Recent CK trend was downwards but 2 days ago her level was still up at 6715.  I am unclear as to the cause of her symptoms.  She has not had any kidney failure thankfully.  We will recheck labs today including autoimmune screenings  and rheumatological screening tests and repeat CK level.  We will make sure her liver and kidney function are okay.  She is strongly advised to continue to stay well-hydrated and well rested.  She is keen on returning back to work.  She can go back to work as long as she continues to hydrate well at does not do any heavy lifting.  She does not typically have to lift any heavy boxes and is encouraged to lift but not straining her back or hamstrings.  She is furthermore advised to proceed with EMG nerve conduction testing through our office.  We will keep her posted as to her blood test results as well as EMG nerve conduction velocity test results.  In the past, she had muscle biopsy testing from the right lateral vastus muscle as I understand.  She points to her right outer thigh.  Ultimately, she may benefit from seeing a neuromuscular specialist through one of the tertiary care centers.  I would like to keep this in mind and we will decide after initial test results come in.  She would be interested in seeing a specialist and [facility].  For now, we will proceed with laboratory testing today and schedule her for electrophysiological testing.  Her neurological exam is normal today which is rather reassuring. I answered all their questions today and the patient and her mother were in agreement.   Thank you very much for allowing me to participate in the care of this nice patient. If I can be of any further assistance to you please do not hesitate to call me at [phone].  Sincerely,   Huston FoleySaima Kista Robb, MD, PhD

## 2019-10-26 NOTE — Patient Instructions (Addendum)
It was nice to meet you both today.  I am glad to hear that you are feeling better.  I am not quite sure why you have intermittent muscle breakdown and very high levels of CK.  We will check some other labs again today and add other labs including autoimmune markers, Lyme disease marker, lupus markers.  We will call you with your test results.  Your exam looks good today.  He can go back to work but use your good judgment when it comes to lifting and physical activity, do not do any strenuous exercises or squats or running exercises or heavy lifting.  Please stay very well-hydrated and well rested.  We will also schedule you for electrical nerve and muscle testing called EMG nerve conduction velocity testing in our office.  We will call you with the results.  Ultimately, you may benefit from seeing a neuromuscular specialist within neurology and I would like to make a referral once we have more test results.  I think it is important that you establish care with your primary care physician again or a new primary care physician if you do not currently have a PCP.  You will need ongoing evaluation of your blood chemistry profile including kidney function, liver function and CK levels in between.

## 2019-10-27 ENCOUNTER — Telehealth: Payer: Self-pay | Admitting: Neurology

## 2019-10-27 NOTE — Telephone Encounter (Signed)
I received call from [facility] for critical lab alert for elevated CPL 2432 and MB 9.9. This is actually a decline from 6715 from 3 days ago.

## 2019-10-30 ENCOUNTER — Other Ambulatory Visit: Payer: Self-pay

## 2019-10-30 ENCOUNTER — Inpatient Hospital Stay (HOSPITAL_COMMUNITY)
Admission: EM | Admit: 2019-10-30 | Discharge: 2019-11-03 | DRG: 558 | Disposition: A | Discharge status: Home / Self Care | Payer: No Typology Code available for payment source | Attending: Internal Medicine | Admitting: Internal Medicine

## 2019-10-30 ENCOUNTER — Encounter (HOSPITAL_COMMUNITY): Payer: Self-pay

## 2019-10-30 DIAGNOSIS — R7989 Other specified abnormal findings of blood chemistry: Secondary | ICD-10-CM | POA: Diagnosis present

## 2019-10-30 DIAGNOSIS — E669 Obesity, unspecified: Secondary | ICD-10-CM | POA: Diagnosis not present

## 2019-10-30 DIAGNOSIS — E860 Dehydration: Secondary | ICD-10-CM | POA: Diagnosis present

## 2019-10-30 DIAGNOSIS — Z20822 Contact with and (suspected) exposure to covid-19: Secondary | ICD-10-CM | POA: Diagnosis present

## 2019-10-30 DIAGNOSIS — R0602 Shortness of breath: Secondary | ICD-10-CM

## 2019-10-30 DIAGNOSIS — Z6837 Body mass index (BMI) 37.0-37.9, adult: Secondary | ICD-10-CM | POA: Diagnosis not present

## 2019-10-30 DIAGNOSIS — R7303 Prediabetes: Secondary | ICD-10-CM | POA: Diagnosis present

## 2019-10-30 DIAGNOSIS — Z91013 Allergy to seafood: Secondary | ICD-10-CM

## 2019-10-30 DIAGNOSIS — K219 Gastro-esophageal reflux disease without esophagitis: Secondary | ICD-10-CM | POA: Diagnosis present

## 2019-10-30 DIAGNOSIS — M6282 Rhabdomyolysis: Secondary | ICD-10-CM | POA: Diagnosis present

## 2019-10-30 LAB — URINALYSIS, ROUTINE W REFLEX MICROSCOPIC
Bilirubin Urine: NEGATIVE
Glucose, UA: NEGATIVE mg/dL
Hgb urine dipstick: NEGATIVE
Ketones, ur: NEGATIVE mg/dL
Leukocytes,Ua: NEGATIVE
Nitrite: NEGATIVE
Protein, ur: NEGATIVE mg/dL
Specific Gravity, Urine: 1.012 (ref 1.005–1.030)
pH: 5 (ref 5.0–8.0)

## 2019-10-30 LAB — HEPATIC FUNCTION PANEL
ALT: 161 U/L — ABNORMAL HIGH (ref 0–44)
AST: 142 U/L — ABNORMAL HIGH (ref 15–41)
Albumin: 2.6 g/dL — ABNORMAL LOW (ref 3.5–5.0)
Alkaline Phosphatase: 65 U/L (ref 38–126)
Bilirubin, Direct: 0.1 mg/dL (ref 0.0–0.2)
Indirect Bilirubin: 0.6 mg/dL (ref 0.3–0.9)
Total Bilirubin: 0.7 mg/dL (ref 0.3–1.2)
Total Protein: 5.9 g/dL — ABNORMAL LOW (ref 6.5–8.1)

## 2019-10-30 LAB — BASIC METABOLIC PANEL
Anion gap: 8 (ref 5–15)
BUN: 8 mg/dL (ref 6–20)
CO2: 24 mmol/L (ref 22–32)
Calcium: 8.5 mg/dL — ABNORMAL LOW (ref 8.9–10.3)
Chloride: 104 mmol/L (ref 98–111)
Creatinine, Ser: 0.61 mg/dL (ref 0.44–1.00)
GFR calc Af Amer: >60 mL/min (ref >60)
GFR calc non Af Amer: >60 mL/min (ref >60)
Glucose, Bld: 129 mg/dL — ABNORMAL HIGH (ref 70–99)
Potassium: 3.9 mmol/L (ref 3.5–5.1)
Sodium: 136 mmol/L (ref 135–145)

## 2019-10-30 LAB — CBC
HCT: 40.6 % (ref 36.0–46.0)
Hemoglobin: 12.1 g/dL (ref 12.0–15.0)
MCH: 26.8 pg (ref 26.0–34.0)
MCHC: 29.8 g/dL — ABNORMAL LOW (ref 30.0–36.0)
MCV: 90 fL (ref 80.0–100.0)
Platelets: 310 10*3/uL (ref 150–400)
RBC: 4.51 MIL/uL (ref 3.87–5.11)
RDW: 12.7 % (ref 11.5–15.5)
WBC: 9.2 10*3/uL (ref 4.0–10.5)
nRBC: 0 % (ref 0.0–0.2)

## 2019-10-30 LAB — T4, FREE: Free T4: 0.93 ng/dL (ref 0.61–1.12)

## 2019-10-30 LAB — CK: Total CK: 10923 U/L — ABNORMAL HIGH (ref 38–234)

## 2019-10-30 LAB — SARS CORONAVIRUS 2 BY RT PCR (HOSPITAL ORDER, PERFORMED IN ~~LOC~~ HOSPITAL LAB): SARS Coronavirus 2: NEGATIVE

## 2019-10-30 LAB — I-STAT BETA HCG BLOOD, ED (MC, WL, AP ONLY): I-stat hCG, quantitative: <5 m[IU]/mL (ref <5)

## 2019-10-30 MED ORDER — SODIUM CHLORIDE 0.9 % IV SOLN: INTRAVENOUS | Status: DC

## 2019-10-30 MED ORDER — ONDANSETRON HCL 4 MG PO TABS: 4.0000 mg | ORAL_TABLET | Freq: Four times a day (QID) | ORAL | Status: DC | PRN

## 2019-10-30 MED ORDER — ACETAMINOPHEN 325 MG PO TABS
650.0000 mg | ORAL_TABLET | Freq: Four times a day (QID) | ORAL | Status: DC | PRN
  Administered 2019-10-30 – 2019-11-02 (×4): 650 mg via ORAL
  Filled 2019-10-30 (×4): qty 2

## 2019-10-30 MED ORDER — ENOXAPARIN SODIUM 40 MG/0.4ML ~~LOC~~ SOLN
40.0000 mg | SUBCUTANEOUS | Status: DC
  Administered 2019-10-30 – 2019-11-02 (×4): 40 mg via SUBCUTANEOUS
  Filled 2019-10-30 (×4): qty 0.4

## 2019-10-30 MED ORDER — SODIUM CHLORIDE 0.9 % IV BOLUS
1000.0000 mL | Freq: Once | INTRAVENOUS | Status: AC
  Administered 2019-10-30: 1000 mL via INTRAVENOUS

## 2019-10-30 MED ORDER — ACETAMINOPHEN 650 MG RE SUPP: 650.0000 mg | Freq: Four times a day (QID) | RECTAL | Status: DC | PRN

## 2019-10-30 MED ORDER — ONDANSETRON HCL 4 MG/2ML IJ SOLN: 4.0000 mg | Freq: Four times a day (QID) | INTRAMUSCULAR | Status: DC | PRN

## 2019-10-30 MED ORDER — SODIUM CHLORIDE 0.9% FLUSH
3.0000 mL | Freq: Two times a day (BID) | INTRAVENOUS | Status: DC
  Administered 2019-10-30 – 2019-11-01 (×2): 3 mL via INTRAVENOUS

## 2019-10-30 MED ORDER — KETOROLAC TROMETHAMINE 30 MG/ML IJ SOLN
30.0000 mg | INTRAMUSCULAR | Status: AC
  Administered 2019-10-30: 30 mg via INTRAVENOUS
  Filled 2019-10-30: qty 1

## 2019-10-30 MED ORDER — ALBUTEROL SULFATE (2.5 MG/3ML) 0.083% IN NEBU: 2.5000 mg | INHALATION_SOLUTION | Freq: Four times a day (QID) | RESPIRATORY_TRACT | Status: DC | PRN

## 2019-10-30 NOTE — ED Notes (Addendum)
Pt asked to not get out of bed without standby assistance. This NT walked in and saw that the pt had used the bedside commode without calling out for help. Pt. reminded again about not getting out of bed without assistance.

## 2019-10-30 NOTE — ED Provider Notes (Signed)
[facility] EMERGENCY DEPARTMENT Provider Note   CSN: [id number] Arrival date & time: 10/30/19  6387     History Chief Complaint  Patient presents with  . Leg Pain    Kim Farrell is a 24 y.o. female with history of recurrent idiopathic non-traumatic rhabdomyolysis who presents with bilateral leg pain.  Patient states that she was discharged from the hospital about a week ago.  Since she has been home she has had pain going up and down the flight of stairs to her house.  The pain is in both legs but is worse than the right.  She states that the pain is in her bilateral knees and her anterior thigh.  Movement makes it worse as well as touching the area.  Nothing is making it better.  It feels different from when she is had rhabdo in the past which usually is a bilateral cramping in her calves and posterior thigh.  She is not feeling cramping at this time.  She states that she needs to make an appointment with physical therapy. She states that she tried to make an appointment with a rheumatologist but they told her to see a neurologist who she saw earlier this week. They did labs which showed CK is trending down - it was 2,432 on July 1st.  HPI     Past Medical History:  Diagnosis Date  . Allergy    pollen, scallops, cantelope,   . Eczema   . Obesity   . PCOS (polycystic ovarian syndrome) 2017   pt self reported as reason for being on metformin  . Pre-diabetes   . Rhabdomyolysis   . Rhabdomyolysis 03/2016    Patient Active Problem List   Diagnosis Date Noted  . Rhabdomyolysis 10/15/2019  . Elevated transaminase level 10/15/2019  . Hypokalemia 04/09/2016  . Diabetes mellitus during pregnancy, antepartum 04/09/2016  . GERD (gastroesophageal reflux disease) 04/09/2016  . Morbid obesity (HCC) 04/09/2016  . H/O multiple allergies 04/09/2016  . Prediabetes   . Non-traumatic rhabdomyolysis   . 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 Elevated CK   . Myalgia      Past Surgical History:  Procedure Laterality Date  . ADENOIDECTOMY  2015  . MUSCLE BIOPSY Right 04/14/2016   Procedure: MUSCLE BIOPSY;  Surgeon: Berna Bue, MD;  Location: [facility] OR;  Service: General;  Laterality: Right;  . TONSILLECTOMY       OB History   No obstetric history on file.     Family History  Problem Relation Age of Onset  . Allergic rhinitis Maternal Aunt   . Angioedema Mother        only lip for 1 year in 2011 with hives  . Hypertension Mother   . Hyperlipidemia Mother   . Schizophrenia Maternal Uncle   . Diabetes Maternal Grandmother   . Hypertension Maternal Grandmother   . Asthma Neg Hx   . Atopy Neg Hx   . Eczema Neg Hx   . Immunodeficiency Neg Hx   . Urticaria Neg Hx   . Thyroid disease Neg Hx     Social History   Tobacco Use  . Smoking status: Never Smoker  . Smokeless tobacco: Never Used  Substance Use Topics  . Alcohol use: No  . Drug use: No    Home Medications Prior to Admission medications   Medication Sig Start Date End Date Taking? Authorizing Provider  ibuprofen (ADVIL) 200 MG tablet Take 400 mg by mouth as needed for fever, headache  or mild pain.    Yes [provider]    Allergies    Cantaloupe (diagnostic), Other, and Scallops [shellfish allergy]  Review of Systems   Review of Systems  Respiratory: Negative for shortness of breath.   Cardiovascular: Negative for chest pain.  Gastrointestinal: Negative for abdominal pain.  Musculoskeletal: Positive for arthralgias and myalgias.  Neurological: Negative for weakness and numbness.  All other systems reviewed and are negative.   Physical Exam Updated Vital Signs BP 121/64   Pulse 80   Temp 98.3 F (36.8 C) (Oral)   Resp 16   SpO2 100%   Physical Exam Vitals and nursing note reviewed.  Constitutional:      General: She is not in acute distress.    Appearance: She is well-developed. She is obese. She is not ill-appearing.  HENT:     Head:  Normocephalic and atraumatic.  Eyes:     General: No scleral icterus.       Right eye: No discharge.        Left eye: No discharge.     Conjunctiva/sclera: Conjunctivae normal.     Pupils: Pupils are equal, round, and reactive to light.  Cardiovascular:     Rate and Rhythm: Normal rate and regular rhythm.  Pulmonary:     Effort: Pulmonary effort is normal. No respiratory distress.     Breath sounds: Normal breath sounds.  Abdominal:     General: There is no distension.  Musculoskeletal:     Cervical back: Normal range of motion.     Comments: Right leg: Scar from prior muscle biopsy over the anterior-lateral thigh. Pain with light palpation of the skin. No hip tenderness. Pain elicited with hip and knee range of motion. Compartments are soft. 2+ DP pulse  Left leg: No obvious swelling or deformity. Normal ROM of the hip. Significantly decreased ROM of the knee. No focal tenderness of the thigh, knee, or calf. 2+ DP pulse  Skin:    General: Skin is warm and dry.  Neurological:     Mental Status: She is alert and oriented to person, place, and time.  Psychiatric:        Behavior: Behavior normal.     ED Results / Procedures / Treatments   Labs (all labs ordered are listed, but only abnormal results are displayed) Labs Reviewed  BASIC METABOLIC PANEL - Abnormal; Notable for the following components:      Result Value   Glucose, Bld 129 (*)    Calcium 8.5 (*)    All other components within normal limits  CBC - Abnormal; Notable for the following components:   MCHC 29.8 (*)    All other components within normal limits  CK - Abnormal; Notable for the following components:   Total CK 10,923 (*)    All other components within normal limits  SARS CORONAVIRUS 2 BY RT PCR (HOSPITAL ORDER, PERFORMED IN  HOSPITAL LAB)  URINALYSIS, ROUTINE W REFLEX MICROSCOPIC  HEPATIC FUNCTION PANEL  I-STAT BETA HCG BLOOD, ED ([facility], [facility], [facility] ONLY)    EKG None  Radiology No results  found.  Procedures Procedures (including critical care time)  Medications Ordered in ED Medications  sodium chloride 0.9 % bolus 1,000 mL (has no administration in time range)    ED Course  I have reviewed the triage vital signs and the nursing notes.  Pertinent labs & imaging results that were available during my care of the patient were reviewed by me and considered in  my medical decision making (see chart for details).  24 year old female with bilateral leg pain which has been worsening after the past week after being admitted for rhabdomyolysis. Her vital signs are normal. She is alert, calm, cooperative. Pt is having hyperesthesia of the skin as she has severe pain with light palpation and pain with ROM of the joints, particularly on the right side. It feels different from prior episodes of rhabdo. Blood work was rechecked today and shows normal kidney function and trending up CK. Discussed with attending Dr. Freida Busman. Will admit for further management  Discussed with Dr. Katrinka Blazing with [facility] who will admit.  MDM Rules/Calculators/A&P                           Final Clinical Impression(s) / ED Diagnoses Final diagnoses:  Non-traumatic rhabdomyolysis    Rx / DC Orders ED Discharge Orders    None       Bethel Born, PA-C 10/30/19 1103    Lorre Nick, MD 11/02/19 1710

## 2019-10-30 NOTE — ED Triage Notes (Signed)
Pt c.o bilateral upper leg pain for the past 3 days, pt recently discharged from [facility] for rhabdomyolysis and states this pain feels similar but she also has to walk up a long flight of stairs at her house so she is unsure if it is from that. Pt ambulatory.

## 2019-10-30 NOTE — H&P (Signed)
History and Physical    Kim Farrell TKW:[id number] DOB: 19-Jun-1995 DOA: 10/30/2019  Referring MD/NP/PA: Terance Hart, PA-C PCP: Renaye Rakers, MD  Patient coming from: Home  Chief Complaint: Leg pain  I have personally briefly reviewed patient's old medical records in [place] [facility]   HPI: Kim Farrell is a 24 y.o. female with medical history significant of of PCOS, obesity, allergic rhinitis, eczema,  prediabetes, and recurrent rhabdomyolysis presents with complaints of bilateral thigh pain over the last 3 days.   Patient just recently hospitalized from 6/20-6/27 for nontraumatic rhabdomyolysis.  She reports having woke up and down steps to get to her apartment.  At discharge she follow-up with her primary care provider and was also seen at [facility] neurology where she had further work-up of possible causes.  Review of recent labs from 7/1 found to have positive Epstein-Barr VCA and NA IgG, TSH 7.25, CRP 31, CK 2432, and CK-MB 9.7.  She reports having walked up 1 flight of stairs up to her apartment, but denies any recent falls or trauma.  She has been trying to keep yourself hydrated at home.  This is the patient's fifth hospitalization with rhabdomyolysis since 2017, but she has never had recur so soon.  She previously had muscle biopsies taken back in 2017 which showed no acute abnormality and has had extensive work-ups in the past.  ED Course: Upon admission into the emergency department patient was noted to have stable vital signs.  Labs significant for CK 10,923.  Hepatic function panel pending. Urinalysis negative for signs of hemoglobin. Patient had been given 1 L normal saline IV fluids.  TRH called for admission for recurrent nontraumatic rhabdomyolysis.    Review of Systems  Constitutional: Negative for fever.  HENT: Negative for congestion and nosebleeds.   Eyes: Negative for photophobia and pain.  Respiratory: Negative for cough and sputum production.     Cardiovascular: Positive for leg swelling. Negative for chest pain.  Gastrointestinal: Negative for abdominal pain, nausea and vomiting.  Genitourinary: Negative for dysuria and frequency.  Musculoskeletal: Positive for myalgias. Negative for falls.  Neurological: Negative for focal weakness and loss of consciousness.  Psychiatric/Behavioral: Negative for memory loss and substance abuse.    Past Medical History:  Diagnosis Date  . Allergy    pollen, scallops, cantelope,   . Eczema   . Obesity   . PCOS (polycystic ovarian syndrome) 2017   pt self reported as reason for being on metformin  . Pre-diabetes   . Rhabdomyolysis   . Rhabdomyolysis 03/2016    Past Surgical History:  Procedure Laterality Date  . ADENOIDECTOMY  2015  . MUSCLE BIOPSY Right 04/14/2016   Procedure: MUSCLE BIOPSY;  Surgeon: Berna Bue, MD;  Location: [facility] OR;  Service: General;  Laterality: Right;  . TONSILLECTOMY       reports that she has never smoked. She has never used smokeless tobacco. She reports that she does not drink alcohol and does not use drugs.  Allergies  Allergen Reactions  . Cantaloupe (Diagnostic) Swelling    lips  . Other Other (See Comments)    Cardic DM (a cough medicine) turns red   . Scallops [Shellfish Allergy] Swelling    Lip swelling    Family History  Problem Relation Age of Onset  . Allergic rhinitis Maternal Aunt   . Angioedema Mother        only lip for 1 year in 2011 with hives  . Hypertension Mother   .  Hyperlipidemia Mother   . Schizophrenia Maternal Uncle   . Diabetes Maternal Grandmother   . Hypertension Maternal Grandmother   . Asthma Neg Hx   . Atopy Neg Hx   . Eczema Neg Hx   . Immunodeficiency Neg Hx   . Urticaria Neg Hx   . Thyroid disease Neg Hx     Prior to Admission medications   Medication Sig Start Date End Date Taking? Authorizing Provider  ibuprofen (ADVIL) 200 MG tablet Take 400 mg by mouth as needed for fever, headache or mild pain.     Yes [provider]    Physical Exam:  Constitutional: Young female currently in NAD, calm, comfortable Vitals:   10/30/19 0826 10/30/19 0845  BP: (!) 125/103 121/64  Pulse: 81 80  Resp: 16   Temp: 98.3 F (36.8 C)   TempSrc: Oral   SpO2: 100% 100%   Eyes: PERRL, lids and conjunctivae normal ENMT: Mucous membranes are moist. Posterior pharynx clear of any exudate or lesions. Normal dentition.  Neck: normal, supple, no masses, no thyromegaly Respiratory: clear to auscultation bilaterally, no wheezing, no crackles. Normal respiratory effort. No accessory muscle use.  Cardiovascular: Regular rate and rhythm, no murmurs / rubs / gallops. No extremity edema. 2+ pedal pulses. No carotid bruits.  Abdomen: no tenderness, no masses palpated. No hepatosplenomegaly. Bowel sounds positive.  Musculoskeletal: no clubbing / cyanosis.  Bilateral thighs were noted to be soft to palpation, but patient noted to be in severe pain with palpation of her thighs. Skin: no rashes, lesions, ulcers. No induration Neurologic: CN 2-12 grossly intact. Sensation intact, DTR normal. Strength 5/5 in all 4.  Psychiatric: Normal judgment and insight. Alert and oriented x 3. Normal mood.     Labs on Admission: I have personally reviewed following labs and imaging studies  CBC: Recent Labs  Lab 10/30/19 0833  WBC 9.2  HGB 12.1  HCT 40.6  MCV 90.0  PLT 310   Basic Metabolic Panel: Recent Labs  Lab 10/24/19 1008 10/26/19 0933 10/30/19 0833  NA 137 139 136  K 3.9 3.7 3.9  CL 103 103 104  CO2 21 21 24   GLUCOSE 90 81 129*  BUN 12 9 8   CREATININE 0.77 0.66 0.61  CALCIUM 9.3 9.2 8.5*   GFR: Estimated Creatinine Clearance: 127.5 mL/min (by C-G formula based on SCr of 0.61 mg/dL). Liver Function Tests: Recent Labs  Lab 10/24/19 1008 10/26/19 0933  AST 197* 111*  ALT 338* 207*  ALKPHOS 115 100  BILITOT 0.4 0.4  PROT 7.2 6.9  ALBUMIN 3.7* 3.7*   No results for input(s): LIPASE,  AMYLASE in the last 168 hours. No results for input(s): AMMONIA in the last 168 hours. Coagulation Profile: No results for input(s): INR, PROTIME in the last 168 hours. Cardiac Enzymes: Recent Labs  Lab 10/24/19 1008 10/26/19 0933 10/30/19 0833  CKTOTAL 6,715* 2,432* 10,923*  CKMBINDEX  --  9.7*  --    BNP (last 3 results) No results for input(s): PROBNP in the last 8760 hours. HbA1C: No results for input(s): HGBA1C in the last 72 hours. CBG: No results for input(s): GLUCAP in the last 168 hours. Lipid Profile: No results for input(s): CHOL, HDL, LDLCALC, TRIG, CHOLHDL, LDLDIRECT in the last 72 hours. Thyroid Function Tests: No results for input(s): TSH, T4TOTAL, FREET4, T3FREE, THYROIDAB in the last 72 hours. Anemia Panel: No results for input(s): VITAMINB12, FOLATE, FERRITIN, TIBC, IRON, RETICCTPCT in the last 72 hours. Urine analysis:    Component Value  Date/Time   COLORURINE YELLOW 10/15/2019 2236   APPEARANCEUR HAZY (A) 10/15/2019 2236   LABSPEC 1.023 10/15/2019 2236   PHURINE 6.0 10/15/2019 2236   GLUCOSEU NEGATIVE 10/15/2019 2236   HGBUR NEGATIVE 10/15/2019 2236   BILIRUBINUR NEGATIVE 10/15/2019 2236   KETONESUR NEGATIVE 10/15/2019 2236   PROTEINUR NEGATIVE 10/15/2019 2236   NITRITE NEGATIVE 10/15/2019 2236   LEUKOCYTESUR NEGATIVE 10/15/2019 2236   Sepsis Labs: No results found for this or any previous visit (from the past 240 hour(s)).   Radiological Exams on Admission: No results found.    Assessment/Plan Nontraumatic rhabdomyolysis: Recurrent.  Patient presents with complaints of bilateral upper leg pain.  Just recently hospitalized for recurrent rhabdomyolysis discharged home on 6/27.  Patient reports having to walk up steps to get to her home.  CK elevated up to 10,923 on admission and kidney function currently within normal limits.  Urinalysis without signs of hemoglobin. -Admit to MedSurg bed -Strict bedrest -Normal saline IV fluids 150 mL/h -Daily  monitoring of CK level -Continue outpatient follow-up with neurology for EMG studies and referral to neuromuscular specialist  Elevated TSH: Acute.  TSH from 7/1 was elevated at 7.25. -Check free T4  Abnormal Epstein-Barr studies: Patient recently had elevated Epstein-Barr VCA and NA IgG.  IgM study was negative.  It appears levels have been abnormal since 2017.  Discussed case with ID over the phone who did not feel there was acute infection and did not recommend any intervention at this time.  Prediabetes: Hemoglobin A1c 5.9 on 7/1.   -Continue outpatient follow-up and monitoring  Obesity: BMI 37.64 kg/m -Continue to counsel on need of weight loss through diet and exercise DVT prophylaxis: Lovenox Code Status: Full Family Communication: Patient's mother updated at bedside Disposition Plan: Patient to need to stay at least 2 midnights to receive aggressive IV fluid hydration for rhabdomyolysis Consults called: None Admission status: Inpatient   Clydie Braun MD [facility] Pager [phone]   If 7PM-7AM, please contact night-coverage [email or web address] Password [email or web address]  10/30/2019, 11:00 AM

## 2019-10-31 ENCOUNTER — Telehealth: Payer: Self-pay

## 2019-10-31 ENCOUNTER — Inpatient Hospital Stay (HOSPITAL_COMMUNITY): Payer: No Typology Code available for payment source

## 2019-10-31 LAB — BASIC METABOLIC PANEL
Anion gap: 9 (ref 5–15)
BUN: 6 mg/dL (ref 6–20)
CO2: 22 mmol/L (ref 22–32)
Calcium: 7.8 mg/dL — ABNORMAL LOW (ref 8.9–10.3)
Chloride: 109 mmol/L (ref 98–111)
Creatinine, Ser: 0.48 mg/dL (ref 0.44–1.00)
GFR calc Af Amer: >60 mL/min (ref >60)
GFR calc non Af Amer: >60 mL/min (ref >60)
Glucose, Bld: 82 mg/dL (ref 70–99)
Potassium: 4.5 mmol/L (ref 3.5–5.1)
Sodium: 140 mmol/L (ref 135–145)

## 2019-10-31 LAB — CK TOTAL AND CKMB (NOT AT ARMC)
CK, MB: 10.3 ng/mL — ABNORMAL HIGH (ref 0.5–5.0)
Total CK: 10312 U/L — ABNORMAL HIGH (ref 38–234)

## 2019-10-31 LAB — SEDIMENTATION RATE: Sed Rate: 31 mm/hr — ABNORMAL HIGH (ref 0–22)

## 2019-10-31 LAB — CORTISOL: Cortisol, Plasma: 5.2 ug/dL

## 2019-10-31 MED ORDER — COSYNTROPIN 0.25 MG IJ SOLR
0.2500 mg | Freq: Once | INTRAMUSCULAR | Status: AC
  Administered 2019-11-01: 0.25 mg via INTRAVENOUS
  Filled 2019-10-31: qty 0.25

## 2019-10-31 MED ORDER — GADOBUTROL 1 MMOL/ML IV SOLN
10.0000 mL | Freq: Once | INTRAVENOUS | Status: AC | PRN
  Administered 2019-10-31: 10 mL via INTRAVENOUS

## 2019-10-31 MED ORDER — HYDROCODONE-ACETAMINOPHEN 5-325 MG PO TABS
1.0000 | ORAL_TABLET | ORAL | Status: DC | PRN
  Administered 2019-10-31: 1 via ORAL
  Filled 2019-10-31: qty 1

## 2019-10-31 NOTE — Telephone Encounter (Signed)
I called pt. No answer, left a message asking pt to call me back.   

## 2019-10-31 NOTE — Telephone Encounter (Signed)
-----   Message from Huston Foley, MD sent at 10/31/2019  7:04 AM EDT ----- Several test results are pending but I wanted to update the patient regarding her thyroid function which was abnormal by screening testing called TSH.  In addition, her CK level has improved nicely, hopefully it will continue to trend down.  Her liver enzymes have also trended down which is good.  Her diabetes screening test called hemoglobin A1c shows findings in the prediabetes range.  It is all the more important that she establish care with a new primary care physician as she may need to be treated for low thyroid function.  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

## 2019-10-31 NOTE — Progress Notes (Signed)
[facility] Progress Note  Patient: Kim Farrell    GMW:[id number]  DOA: 10/30/2019     Date of Service: the patient was seen and examined on 10/31/2019  Brief hospital course: Hannan Fatoumata Albaugh is a 24 y.o. female with medical history significant of of PCOS, obesity, allergic rhinitis, eczema,  prediabetes, and recurrent rhabdomyolysis presents with complaints of bilateral thigh pain over the last 3 days.   Patient just recently hospitalized from 6/20-6/27 for nontraumatic rhabdomyolysis.  She reports having woke up and down steps to get to her apartment.  At discharge she follow-up with her primary care provider and was also seen at [facility] neurology where she had further work-up of possible causes.  Review of recent labs from 7/1 found to have positive Epstein-Barr VCA and NA IgG, TSH 7.25, CRP 31, CK 2432, and CK-MB 9.7.  She reports having walked up 1 flight of stairs up to her apartment, but denies any recent falls or trauma.  She has been trying to keep yourself hydrated at home.  This is the patient's fifth hospitalization with rhabdomyolysis since 2017, but she has never had recur so soon.  She previously had muscle biopsies taken back in 2017 which showed no acute abnormality and has had extensive work-ups in the past.  Currently plan is continue IV fluids and further work-up for rhabdomyolysis.  Assessment and Plan: 1.  Recurrent nontraumatic rhabdomyolysis Presents with bilateral upper leg pain after walking to climb a flight of stairs. CK elevation up to 10,000. Continue with IV fluids. Patient already has outpatient work-up for EMG outpatient. Also has neuromuscular specialist follow-up. We will initiate some basic work-up.  2.  Elevated TSH. Free T4 normal. Continue to monitor.  3.  Abnormal EBV studies. IgM negative. No further work-up for now. Admitting provider discussed with ID who do not feel there was acute infection he did not recommend any  intervention.  4.  Prediabetes Outpatient follow-up.  5.  Obesity BMI 37.64.  Diet: Cardiac diet DVT Prophylaxis: enoxaparin (LOVENOX) injection 40 mg Start: 10/30/19 1800    Advance goals of care discussion: Full code  Family Communication: no family was present at bedside, at the time of interview.   Disposition:  Status is: Inpatient  Remains inpatient appropriate because:IV treatments appropriate due to intensity of illness or inability to take PO   Dispo: The patient is from: Home              Anticipated d/c is to: Home              Anticipated d/c date is: 3 days              Patient currently is not medically stable to d/c.        Subjective:  Reports severe pain.  Primary located on right thigh.  No nausea no vomiting.  No fever no chills.  Physical Exam:  General: Appear in mild distress, no Rash; Oral Mucosa Clear, moist. no Abnormal Neck Mass Or lumps, Conjunctiva normal  Cardiovascular: S1 and S2 Present, no Murmur, Respiratory: good respiratory effort, Bilateral Air entry present and Clear to Auscultation, no Crackles, no wheezes Abdomen: Bowel Sound present, Soft and no tenderness Extremities: Right thigh, no other pedal edema, no calf tenderness Neurology: alert and oriented to time, place, and person affect appropriate. no new focal deficit Gait not checked due to patient safety concerns  Vitals:   10/30/19 2035 10/31/19 0118 10/31/19 0617 10/31/19 1325  BP: 120/76  118/63 108/77 120/62  Pulse: 88 82 87 80  Resp: 18 18 18 18   Temp: 98.4 F (36.9 C) 98.6 F (37 C) 98.7 F (37.1 C) 98.8 F (37.1 C)  TempSrc: Oral Oral Oral Oral  SpO2: 100% 99% 100% 100%    Intake/Output Summary (Last 24 hours) at 10/31/2019 1834 Last data filed at 10/31/2019 1517 Gross per 24 hour  Intake 3524.72 ml  Output --  Net 3524.72 ml   There were no vitals filed for this visit.  Data Reviewed: I have personally reviewed and interpreted daily labs, tele strips,  imagings as discussed above. I reviewed all nursing notes, pharmacy notes, vitals, pertinent old records I have discussed plan of care as described above with RN and patient/family.  CBC: Recent Labs  Lab 10/30/19 0833  WBC 9.2  HGB 12.1  HCT 40.6  MCV 90.0  PLT 310   Basic Metabolic Panel: Recent Labs  Lab 10/26/19 0933 10/30/19 0833 10/31/19 0241  NA 139 136 140  K 3.7 3.9 4.5  CL 103 104 109  CO2 21 24 22   GLUCOSE 81 129* 82  BUN 9 8 6   CREATININE 0.66 0.61 0.48  CALCIUM 9.2 8.5* 7.8*    Studies: DG Chest 2 View  Result Date: 10/31/2019 CLINICAL DATA:  Rhabdomyolysis. EXAM: CHEST - 2 VIEW COMPARISON:  No prior. FINDINGS: Mediastinum hilar structures normal. Low lung volumes. No focal infiltrate. No pleural effusion or pneumothorax. Heart size normal. Degenerative change thoracic spine. IMPRESSION: No acute cardiopulmonary disease. Electronically Signed   By:  Register   On: 10/31/2019 11:35   MR FEMUR RIGHT W WO CONTRAST  Result Date: 10/31/2019 CLINICAL DATA:  Right lower extremity rhabdomyolysis EXAM: MRI OF THE RIGHT FEMUR WITHOUT AND WITH CONTRAST TECHNIQUE: Multiplanar, multisequence MR imaging of the right femur was performed both before and after administration of intravenous contrast. CONTRAST:  33mL GADAVIST GADOBUTROL 1 MMOL/ML IV SOLN COMPARISON:  None. FINDINGS: Bones/Joint/Cartilage No acute fracture. No dislocation. No femoral head avascular necrosis. No bone marrow edema. No suspicious bone lesion. No significant arthropathy of the right hip or knee. No hip or knee joint effusion. Ligaments Intact. Muscles and Tendons There is T2 hyperintense edema like signal throughout the right thigh musculature most predominantly involving the vastus lateralis, rectus femoris, vastus medialis, and distal biceps femoris muscles (series 15, image 59). Fluid signal is most prevalent within the distal vastus lateralis muscle belly (series 15, image 67) without a  well-defined intramuscular fluid collection. There is geographic postcontrast enhancement of the involved portions of the affected muscles. No rim enhancement. No focal areas of non enhancement to suggest myonecrosis. There are similar findings noted to the contralateral thigh musculature seen on full field imaging (series 4, image 32). Soft tissues Mild subcutaneous edema of the visualized bilateral lower extremities. No soft tissue fluid collection. No inguinal lymphadenopathy. Small amount of free fluid within the pelvis, nonspecific and may be physiologic. IMPRESSION: 1. Diffuse intramuscular edema throughout the right thigh musculature most predominantly involving the vastus lateralis, rectus femoris, vastus medialis, and distal biceps femoris muscles with geographic areas of muscle enhancement. Findings are most compatible with the provided history of rhabdomyolysis. No specific MRI evidence to suggest myonecrosis. 2. Similar findings are visualized on included images through the left thigh. Electronically Signed   By: 01/01/2020 D.O.   On: 10/31/2019 13:02    Scheduled Meds:  [START ON 11/01/2019] cosyntropin  0.25 mg Intravenous Once   enoxaparin (LOVENOX) injection  40 mg  Subcutaneous Q24H   sodium chloride flush  3 mL Intravenous Q12H   Continuous Infusions:  sodium chloride 150 mL/hr at 10/31/19 1354   PRN Meds: acetaminophen **OR** acetaminophen, albuterol, ondansetron **OR** ondansetron (ZOFRAN) IV  Time spent: 35 minutes  Author: Lynden Oxford, MD [facility] 10/31/2019 6:34 PM  To reach On-call, see care teams to locate the attending and reach out via [email or web address]. Between 7PM-7AM, please contact night-coverage If you still have difficulty reaching the attending provider, please page the [facility] (Director on Call) for [facility] on amion for assistance.

## 2019-10-31 NOTE — Progress Notes (Signed)
Several test results are pending but I wanted to update the patient regarding her thyroid function which was abnormal by screening testing called TSH.  In addition, her CK level has improved nicely, hopefully it will continue to trend down.  Her liver enzymes have also trended down which is good.  Her diabetes screening test called hemoglobin A1c shows findings in the prediabetes range.  It is all the more important that she establish care with a new primary care physician as she may need to be treated for low thyroid function.  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

## 2019-10-31 NOTE — Telephone Encounter (Signed)
Pt returned my call and we discussed labs. Pt verbalized understanding, pt stated [facility] and was advised by the staff at the [facility] of results already.

## 2019-11-01 LAB — EPSTEIN-BARR VIRUS (EBV) ANTIBODY PROFILE
EBV NA IgG: >600 U/mL — ABNORMAL HIGH (ref 0.0–17.9)
EBV VCA IgG: 551 U/mL — ABNORMAL HIGH (ref 0.0–17.9)
EBV VCA IgM: <36 U/mL (ref 0.0–35.9)

## 2019-11-01 LAB — EXTRACTABLE NUCLEAR ANTIGEN ANTIBODY
ENA SM Ab Ser-aCnc: <0.2 AI (ref 0.0–0.9)
Ribonucleic Protein: 0.3 AI (ref 0.0–0.9)
SSA (Ro) (ENA) Antibody, IgG: <0.2 AI (ref 0.0–0.9)
SSB (La) (ENA) Antibody, IgG: <0.2 AI (ref 0.0–0.9)
Scleroderma (Scl-70) (ENA) Antibody, IgG: <0.2 AI (ref 0.0–0.9)
ds DNA Ab: <1 IU/mL (ref 0–9)

## 2019-11-01 LAB — RHEUMATOID FACTOR: Rheumatoid fact SerPl-aCnc: <10 IU/mL (ref 0.0–13.9)

## 2019-11-01 LAB — COMPREHENSIVE METABOLIC PANEL
ALT: 207 IU/L — ABNORMAL HIGH (ref 0–32)
ALT: 178 U/L — ABNORMAL HIGH (ref 0–44)
AST: 111 IU/L — ABNORMAL HIGH (ref 0–40)
AST: 179 U/L — ABNORMAL HIGH (ref 15–41)
Albumin/Globulin Ratio: 1.2 (ref 1.2–2.2)
Albumin: 3.7 g/dL — ABNORMAL LOW (ref 3.9–5.0)
Albumin: 2.5 g/dL — ABNORMAL LOW (ref 3.5–5.0)
Alkaline Phosphatase: 100 IU/L (ref 48–121)
Alkaline Phosphatase: 61 U/L (ref 38–126)
Anion gap: 8 (ref 5–15)
BUN/Creatinine Ratio: 14 (ref 9–23)
BUN: 9 mg/dL (ref 6–20)
BUN: <5 mg/dL — ABNORMAL LOW (ref 6–20)
Bilirubin Total: 0.4 mg/dL (ref 0.0–1.2)
CO2: 21 mmol/L (ref 20–29)
CO2: 23 mmol/L (ref 22–32)
Calcium: 9.2 mg/dL (ref 8.7–10.2)
Calcium: 8.2 mg/dL — ABNORMAL LOW (ref 8.9–10.3)
Chloride: 103 mmol/L (ref 96–106)
Chloride: 108 mmol/L (ref 98–111)
Creatinine, Ser: 0.66 mg/dL (ref 0.57–1.00)
Creatinine, Ser: 0.57 mg/dL (ref 0.44–1.00)
GFR calc Af Amer: 146 mL/min/{1.73_m2} (ref >59)
GFR calc Af Amer: >60 mL/min (ref >60)
GFR calc non Af Amer: 127 mL/min/{1.73_m2} (ref >59)
GFR calc non Af Amer: >60 mL/min (ref >60)
Globulin, Total: 3.2 g/dL (ref 1.5–4.5)
Glucose, Bld: 91 mg/dL (ref 70–99)
Glucose: 81 mg/dL (ref 65–99)
Potassium: 3.7 mmol/L (ref 3.5–5.2)
Potassium: 3.8 mmol/L (ref 3.5–5.1)
Sodium: 139 mmol/L (ref 134–144)
Sodium: 139 mmol/L (ref 135–145)
Total Bilirubin: 0.7 mg/dL (ref 0.3–1.2)
Total Protein: 6.9 g/dL (ref 6.0–8.5)
Total Protein: 5.7 g/dL — ABNORMAL LOW (ref 6.5–8.1)

## 2019-11-01 LAB — B12 AND FOLATE PANEL
Folate: 10.2 ng/mL (ref >3.0)
Vitamin B-12: 825 pg/mL (ref 232–1245)

## 2019-11-01 LAB — HGB A1C W/O EAG: Hgb A1c MFr Bld: 5.9 % — ABNORMAL HIGH (ref 4.8–5.6)

## 2019-11-01 LAB — ACTH STIMULATION, 3 TIME POINTS
Cortisol, 30 Min: 6.7 ug/dL
Cortisol, 60 Min: 27.7 ug/dL
Cortisol, Base: 5.4 ug/dL

## 2019-11-01 LAB — CK TOTAL AND CKMB (NOT AT ARMC)
CK-MB Index: 9.7 ng/mL (ref 0.0–5.3)
Total CK: 2432 U/L (ref 32–182)

## 2019-11-01 LAB — HEAVY METALS PROFILE II, URINE
Arsenic Ur: 12 ug/L (ref 0–50)
Arsenic(Inorganic),U: NOT DETECTED ug/L (ref 0–19)
Arsenic, 24H Ur: 1 ug/24 hr (ref 0–50)
Cadmium, Ur: NOT DETECTED ug/L
Creatinine(Crt),U: 1.84 g/L (ref 0.30–3.00)
Lead, Rand Ur: NOT DETECTED ug/L (ref 0–49)
Mercury, Ur: NOT DETECTED ug/L (ref 0–19)

## 2019-11-01 LAB — CBC WITH DIFFERENTIAL/PLATELET
Abs Immature Granulocytes: 0.07 10*3/uL (ref 0.00–0.07)
Basophils Absolute: 0.1 10*3/uL (ref 0.0–0.1)
Basophils Relative: 1 %
Eosinophils Absolute: 0.1 10*3/uL (ref 0.0–0.5)
Eosinophils Relative: 1 %
HCT: 35.5 % — ABNORMAL LOW (ref 36.0–46.0)
Hemoglobin: 10.9 g/dL — ABNORMAL LOW (ref 12.0–15.0)
Immature Granulocytes: 1 %
Lymphocytes Relative: 73 %
Lymphs Abs: 6.4 10*3/uL — ABNORMAL HIGH (ref 0.7–4.0)
MCH: 27.1 pg (ref 26.0–34.0)
MCHC: 30.7 g/dL (ref 30.0–36.0)
MCV: 88.3 fL (ref 80.0–100.0)
Monocytes Absolute: 0.4 10*3/uL (ref 0.1–1.0)
Monocytes Relative: 5 %
Neutro Abs: 1.7 10*3/uL (ref 1.7–7.7)
Neutrophils Relative %: 19 %
Platelets: 292 10*3/uL (ref 150–400)
RBC: 4.02 MIL/uL (ref 3.87–5.11)
RDW: 12.9 % (ref 11.5–15.5)
WBC: 8.8 10*3/uL (ref 4.0–10.5)
nRBC: 0 % (ref 0.0–0.2)

## 2019-11-01 LAB — MULTIPLE MYELOMA PANEL, SERUM
Albumin SerPl Elph-Mcnc: 3.2 g/dL (ref 2.9–4.4)
Albumin/Glob SerPl: 0.9 (ref 0.7–1.7)
Alpha 1: 0.3 g/dL (ref 0.0–0.4)
Alpha2 Glob SerPl Elph-Mcnc: 0.8 g/dL (ref 0.4–1.0)
B-Globulin SerPl Elph-Mcnc: 1.1 g/dL (ref 0.7–1.3)
Gamma Glob SerPl Elph-Mcnc: 1.6 g/dL (ref 0.4–1.8)
Globulin, Total: 3.7 g/dL (ref 2.2–3.9)
IgA/Immunoglobulin A, Serum: 193 mg/dL (ref 87–352)
IgG (Immunoglobin G), Serum: 1383 mg/dL (ref 586–1602)
IgM (Immunoglobulin M), Srm: 442 mg/dL — ABNORMAL HIGH (ref 26–217)

## 2019-11-01 LAB — ALDOLASE: Aldolase: 68.1 U/L — ABNORMAL HIGH (ref 3.3–10.3)

## 2019-11-01 LAB — C-REACTIVE PROTEIN: CRP: 31 mg/L — ABNORMAL HIGH (ref 0–10)

## 2019-11-01 LAB — CK: Total CK: 11117 U/L — ABNORMAL HIGH (ref 38–234)

## 2019-11-01 LAB — ANTI-SMOOTH MUSCLE ANTIBODY, IGG: F-Actin IgG: 26 Units — ABNORMAL HIGH (ref 0–19)

## 2019-11-01 LAB — VITAMIN B6: Vitamin B6: 11.8 ug/L (ref 2.0–32.8)

## 2019-11-01 LAB — VITAMIN B1: Thiamine: 97.7 nmol/L (ref 66.5–200.0)

## 2019-11-01 LAB — PHOSPHORUS: Phosphorus: 3.1 mg/dL (ref 2.5–4.6)

## 2019-11-01 LAB — ANTI-JO 1 ANTIBODY, IGG: Anti JO-1: <0.2 AI (ref 0.0–0.9)

## 2019-11-01 LAB — ANA W/REFLEX: Anti Nuclear Antibody (ANA): NEGATIVE

## 2019-11-01 LAB — MAGNESIUM: Magnesium: 1.9 mg/dL (ref 1.7–2.4)

## 2019-11-01 LAB — HEAVY METALS PROFILE II, BLOOD
Arsenic: 11 ug/L (ref 2–23)
Cadmium: <0.5 ug/L (ref 0.0–1.2)
Lead, Blood: <1 ug/dL (ref 0–4)
Mercury: <1 ug/L (ref 0.0–14.9)

## 2019-11-01 LAB — TSH: TSH: 7.25 u[IU]/mL — ABNORMAL HIGH (ref 0.450–4.500)

## 2019-11-01 MED ORDER — SODIUM CHLORIDE 0.9 % IV BOLUS
250.0000 mL | INTRAVENOUS | Status: AC
  Administered 2019-11-01 (×4): 250 mL via INTRAVENOUS

## 2019-11-01 MED ORDER — FUROSEMIDE 10 MG/ML IJ SOLN
20.0000 mg | Freq: Two times a day (BID) | INTRAMUSCULAR | Status: AC
  Administered 2019-11-01 – 2019-11-02 (×2): 20 mg via INTRAVENOUS
  Filled 2019-11-01 (×2): qty 2

## 2019-11-01 NOTE — Progress Notes (Addendum)
[facility] Progress Note  Patient: Kim Farrell    SNK:[id number]  DOA: 10/30/2019     Date of Service: the patient was seen and examined on 11/01/2019  Brief hospital course: Kim Farrell is a 24 y.o. female with medical history significant of of PCOS, obesity, allergic rhinitis, eczema,  prediabetes, and recurrent rhabdomyolysis presents with complaints of bilateral thigh pain over the last 3 days.   Patient just recently hospitalized from 6/20-6/27 for nontraumatic rhabdomyolysis.  She reports having woke up and down steps to get to her apartment.  At discharge she follow-up with her primary care provider and was also seen at [facility] neurology where she had further work-up of possible causes.  Review of recent labs from 7/1 found to have positive Epstein-Barr VCA and NA IgG, TSH 7.25, CRP 31, CK 2432, and CK-MB 9.7.  She reports having walked up 1 flight of stairs up to her apartment, but denies any recent falls or trauma.  She has been trying to keep yourself hydrated at home.  This is the patient's fifth hospitalization with rhabdomyolysis since 2017, but she has never had recur so soon.  She previously had muscle biopsies taken back in 2017 which showed no acute abnormality and has had extensive work-ups in the past. Currently plan is continue IV fluids and further work-up for rhabdomyolysis.  Assessment and Plan: Principal Problem:   Non-traumatic rhabdomyolysis Active Problems:   Prediabetes   Elevated TSH   Obesity (BMI 30-39.9)   Recurrent nontraumatic rhabdomyolysis -Presents with bilateral upper leg pain after walking to climbing a flight of stairs "a few times" -CK continues to rise despite IV fluids -Small bolus on top of IV fluids today and attempt to increase clearance -Lasix 20 IV x2 today to increase urine output -creatinine remains within normal limits -Reports "leg shaking/spasms" at night - ?RLS vs vitamin deficiency - unlikely profound enough to  cause rhabdo on this scale. -Previously scheduled EMG outpatient -keep this appointment -Also has neuromuscular specialist follow-up. -Typical causes remain negative - no trauma, prolonged down time, crush injury, or prolonged immobilizations, extreme activity in the heat. -Other causes of Rhabdo remain negative:   - EBV likely nonacute as below; HIV negative  - Potassium WNL   - Denies alcohol use  - Denies drug use  - Endocrine: TSH elevated (7.2) T4 WNL   - Inflammatory: CRP/ESR minimally elevated  - Autoimmune: ANA, AntiJo, dsDNA, Ro/La/ScL70 all negative; IgM isolated and elevated alone  - No seizure history/hyperkinetic states  - No known sickle cell trait/disorder  Elevated TSH. Free T4 normal. Continue to monitor.  Abnormal EBV studies. IgG positive consistent with old infection: IgM negative indicating no acute infection. No further work-up for now per discussion with ID  Elevated liver panel -Likely secondary to above rhabdo, dehydration -Continue IV fluids -Downtrending appropriately Hepatic Function Latest Ref Rng & Units 11/01/2019 10/30/2019 10/26/2019  Total Protein 6.5 - 8.1 g/dL 5.7(L) 5.9(L) 6.9  Albumin 3.5 - 5.0 g/dL 2.5(L) 2.6(L) 3.7(L)  AST 15 - 41 U/L 179(H) 142(H) 111(H)  ALT 0 - 44 U/L 178(H) 161(H) 207(H)  Alk Phosphatase 38 - 126 U/L 61 65 100  Total Bilirubin 0.3 - 1.2 mg/dL 0.7 0.7 0.4  Bilirubin, Direct 0.0 - 0.2 mg/dL - 0.1 -   Prediabetes Outpatient follow-up Lab Results  Component Value Date   HGBA1C 5.9 (H) 10/26/2019   Morbid obesity BMI 37.64.  Diet: Cardiac diet DVT Prophylaxis: enoxaparin (LOVENOX) injection 40 mg Start:  10/30/19 1800 Code status: Full code Family Communication: no family was present at bedside, at the time of interview.   Disposition:  Status is: Inpatient  Remains inpatient appropriate because:IV treatments appropriate due to intensity of illness or inability to take PO   Dispo: The patient is from: Home               Anticipated d/c is to: Home              Anticipated d/c date is: 3 days              Patient currently is not medically stable to d/c.    Subjective:  Reports severe pain.  Primary located on right thigh.  No nausea no vomiting.  No fever no chills.  Physical Exam:  General: Appear in mild distress, no Rash; Oral Mucosa Clear, moist. no Abnormal Neck Mass Or lumps, Conjunctiva normal  Cardiovascular: S1 and S2 Present, no Murmur, Respiratory: good respiratory effort, Bilateral Air entry present and Clear to Auscultation, no Crackles, no wheezes Abdomen: Bowel Sound present, Soft and no tenderness Extremities: Minimal edema/tenderness to proximal legs bilaterally Neurology: alert and oriented to time, place, and person affect appropriate. no new focal deficit Gait not checked due to patient safety concerns  Vitals:   10/31/19 0617 10/31/19 1325 10/31/19 2019 11/01/19 0430  BP: 108/77 120/62 113/66 118/62  Pulse: 87 80 89 78  Resp: '18 18 16 14  ' Temp: 98.7 F (37.1 C) 98.8 F (37.1 C) 98.5 F (36.9 C) 98.2 F (36.8 C)  TempSrc: Oral Oral Oral Oral  SpO2: 100% 100% 100% 99%    Intake/Output Summary (Last 24 hours) at 11/01/2019 0819 Last data filed at 11/01/2019 0500 Gross per 24 hour  Intake 621.01 ml  Output 300 ml  Net 321.01 ml   There were no vitals filed for this visit.  Data Reviewed: I have personally reviewed and interpreted daily labs, tele strips, imagings as discussed above. I reviewed all nursing notes, pharmacy notes, vitals, pertinent old records I have discussed plan of care as described above with RN and patient/family.  CBC: Recent Labs  Lab 10/30/19 0833 11/01/19 0327  WBC 9.2 8.8  NEUTROABS  --  1.7  HGB 12.1 10.9*  HCT 40.6 35.5*  MCV 90.0 88.3  PLT 310 161   Basic Metabolic Panel: Recent Labs  Lab 10/26/19 0933 10/30/19 0833 10/31/19 0241 11/01/19 0327  NA 139 136 140 139  K 3.7 3.9 4.5 3.8  CL 103 104 109 108  CO2 '21 24 22 23   ' GLUCOSE 81 129* 82 91  BUN '9 8 6 ' <5*  CREATININE 0.66 0.61 0.48 0.57  CALCIUM 9.2 8.5* 7.8* 8.2*  MG  --   --   --  1.9  PHOS  --   --   --  3.1    Studies: DG Chest 2 View  Result Date: 10/31/2019 CLINICAL DATA:  Rhabdomyolysis. EXAM: CHEST - 2 VIEW COMPARISON:  No prior. FINDINGS: Mediastinum hilar structures normal. Low lung volumes. No focal infiltrate. No pleural effusion or pneumothorax. Heart size normal. Degenerative change thoracic spine. IMPRESSION: No acute cardiopulmonary disease. Electronically Signed   By: Marcello Moores  Register   On: 10/31/2019 11:35   MR FEMUR RIGHT W WO CONTRAST  Result Date: 10/31/2019 CLINICAL DATA:  Right lower extremity rhabdomyolysis EXAM: MRI OF THE RIGHT FEMUR WITHOUT AND WITH CONTRAST TECHNIQUE: Multiplanar, multisequence MR imaging of the right femur was performed both before and after administration  of intravenous contrast. CONTRAST:  55m GADAVIST GADOBUTROL 1 MMOL/ML IV SOLN COMPARISON:  None. FINDINGS: Bones/Joint/Cartilage No acute fracture. No dislocation. No femoral head avascular necrosis. No bone marrow edema. No suspicious bone lesion. No significant arthropathy of the right hip or knee. No hip or knee joint effusion. Ligaments Intact. Muscles and Tendons There is T2 hyperintense edema like signal throughout the right thigh musculature most predominantly involving the vastus lateralis, rectus femoris, vastus medialis, and distal biceps femoris muscles (series 15, image 59). Fluid signal is most prevalent within the distal vastus lateralis muscle belly (series 15, image 67) without a well-defined intramuscular fluid collection. There is geographic postcontrast enhancement of the involved portions of the affected muscles. No rim enhancement. No focal areas of non enhancement to suggest myonecrosis. There are similar findings noted to the contralateral thigh musculature seen on full field imaging (series 4, image 32). Soft tissues Mild subcutaneous edema of  the visualized bilateral lower extremities. No soft tissue fluid collection. No inguinal lymphadenopathy. Small amount of free fluid within the pelvis, nonspecific and may be physiologic. IMPRESSION: 1. Diffuse intramuscular edema throughout the right thigh musculature most predominantly involving the vastus lateralis, rectus femoris, vastus medialis, and distal biceps femoris muscles with geographic areas of muscle enhancement. Findings are most compatible with the provided history of rhabdomyolysis. No specific MRI evidence to suggest myonecrosis. 2. Similar findings are visualized on included images through the left thigh. Electronically Signed   By: NDavina PokeD.O.   On: 10/31/2019 13:02    Scheduled Meds:  enoxaparin (LOVENOX) injection  40 mg Subcutaneous Q24H   sodium chloride flush  3 mL Intravenous Q12H   Continuous Infusions:  sodium chloride 150 mL/hr at 11/01/19 0234   PRN Meds: acetaminophen **OR** acetaminophen, albuterol, HYDROcodone-acetaminophen, ondansetron **OR** ondansetron (ZOFRAN) IV  Time spent: 35 minutes  Author: WHolli HumblesDO [facility] 11/01/2019 8:19 AM  Between 7PM-7AM, please contact night-coverage If you still have difficulty reaching the attending provider, please page the [facility](Director on Call) for [facility] on amion for assistance.

## 2019-11-02 LAB — COMPREHENSIVE METABOLIC PANEL
ALT: 235 U/L — ABNORMAL HIGH (ref 0–44)
AST: 234 U/L — ABNORMAL HIGH (ref 15–41)
Albumin: 2.8 g/dL — ABNORMAL LOW (ref 3.5–5.0)
Alkaline Phosphatase: 68 U/L (ref 38–126)
Anion gap: 7 (ref 5–15)
BUN: 7 mg/dL (ref 6–20)
CO2: 25 mmol/L (ref 22–32)
Calcium: 8.5 mg/dL — ABNORMAL LOW (ref 8.9–10.3)
Chloride: 107 mmol/L (ref 98–111)
Creatinine, Ser: 0.59 mg/dL (ref 0.44–1.00)
GFR calc Af Amer: >60 mL/min (ref >60)
GFR calc non Af Amer: >60 mL/min (ref >60)
Glucose, Bld: 93 mg/dL (ref 70–99)
Potassium: 3.9 mmol/L (ref 3.5–5.1)
Sodium: 139 mmol/L (ref 135–145)
Total Bilirubin: 0.6 mg/dL (ref 0.3–1.2)
Total Protein: 6.1 g/dL — ABNORMAL LOW (ref 6.5–8.1)

## 2019-11-02 LAB — CBC
HCT: 36.1 % (ref 36.0–46.0)
Hemoglobin: 11 g/dL — ABNORMAL LOW (ref 12.0–15.0)
MCH: 27.2 pg (ref 26.0–34.0)
MCHC: 30.5 g/dL (ref 30.0–36.0)
MCV: 89.4 fL (ref 80.0–100.0)
Platelets: 322 10*3/uL (ref 150–400)
RBC: 4.04 MIL/uL (ref 3.87–5.11)
RDW: 13 % (ref 11.5–15.5)
WBC: 10.4 10*3/uL (ref 4.0–10.5)
nRBC: 0 % (ref 0.0–0.2)

## 2019-11-02 LAB — PATHOLOGIST SMEAR REVIEW

## 2019-11-02 LAB — CK: Total CK: 9752 U/L — ABNORMAL HIGH (ref 38–234)

## 2019-11-02 NOTE — Progress Notes (Signed)
PROGRESS NOTE    Kim Farrell  BJS:[id number] DOB: 1995/06/22 DOA: 10/30/2019 PCP: Lucianne Lei, MD    Brief Narrative:  Kim Farrell a 24 y.o.femalewith medical history significant ofof PCOS, obesity, allergic rhinitis, eczema,prediabetes, and recurrent rhabdomyolysis presented with complaints of bilateralthigh pain over the last 3 days.  Patient just recently hospitalized from6/20-6/27 for nontraumatic rhabdomyolysis. This is her fifth episode of hospitalization.  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  She was discharged after stabilization, she followed up with [facility] neurology where she had further work-up of possible causes. Review of recent labs from 7/1 found to have positive Epstein-Barr VCA and NA IgG,TSH 7.25, CRP 31, CK 2432, and CK-MB 9.7.She reports having walked up 1 flight of stairs up to her apartment, but denies any recent falls or trauma. She has been trying to keep yourself hydrated at home. This is the patient's fifth hospitalization with rhabdomyolysis since 2017, but she has never had recur so soon. She previously had muscle biopsies taken back in 2017 which showed no acute abnormality and has had extensive work-ups in the past.   Assessment & Plan:   Principal Problem:   Non-traumatic rhabdomyolysis Active Problems:   Prediabetes   Elevated TSH   Obesity (BMI 30-39.9)  Recurrent nontraumatic rhabdomyolysis -Presents with bilateral upper leg pain after walking to climbing a flight of stairs "a few times" -Exact cause is unknown.  Clinically and biochemically improving.  Levels are still more than 9000.  Renal functions are normal.  Discontinue Lasix.  Continue maintenance IV fluids and mobilize today. -Previously scheduled EMG outpatient -keep this appointment -Also has neuromuscular specialist follow-up. -Typical causes remain negative - no trauma, prolonged down time, crush injury, or  prolonged immobilizations, extreme activity in the heat. -Other causes of Rhabdo remain negative:              - EBV likely nonacute as below; HIV negative             - Potassium WNL             - Denies alcohol use             - Denies drug use             - Endocrine: TSH elevated (7.2) T4 WNL              - Inflammatory: CRP/ESR minimally elevated             - Autoimmune: ANA, AntiJo, dsDNA, Ro/La/ScL70 all negative; IgM isolated and elevated alone             - No seizure history/hyperkinetic states             - No known sickle cell trait/disorder             - she works in a warehouse since long time.  Elevated TSH. Free T4 normal. Continue to monitor.  Abnormal EBV studies. IgG positive consistent with old infection: IgM negative indicating no acute infection. No further work-up for now per discussion with ID  Elevated transaminase. Synthetic function is normal.  AST and ALT probably from muscles.   DVT prophylaxis: enoxaparin (LOVENOX) injection 40 mg Start: 10/30/19 1800   Code Status: Full code Family Communication: Mom at the bedside Disposition Plan: Status is: Inpatient  Remains inpatient appropriate because:Persistent severe electrolyte disturbances and IV treatments appropriate due to intensity of illness or inability to take  PO   Dispo: The patient is from: Home              Anticipated d/c is to: Home              Anticipated d/c date is: 1 day              Patient currently is not medically stable to d/c.  Still needing IV fluids.  CK level is more than 9000.         Consultants:   None  Procedures:   None  Antimicrobials:   None   Subjective: Patient seen and examined.  Today she denies any complaints.  Her muscle pain is better today.  Urine looks clear and urinating frequently.  Up and about.  Walking.  Objective: Vitals:   11/01/19 0430 11/01/19 1346 11/01/19 2015 11/02/19 0440  BP: 118/62 122/75 (!) 113/57 106/72  Pulse: 78  81 81 68  Resp: '14 17 17 17  ' Temp: 98.2 F (36.8 C) 98.7 F (37.1 C) 99.5 F (37.5 C) 98.4 F (36.9 C)  TempSrc: Oral Oral Oral Oral  SpO2: 99% 100% 100% 100%    Intake/Output Summary (Last 24 hours) at 11/02/2019 0828 Last data filed at 11/02/2019 5625 Gross per 24 hour  Intake 5395.51 ml  Output --  Net 5395.51 ml   There were no vitals filed for this visit.  Examination:  General exam: Appears calm and comfortable  Respiratory system: Clear to auscultation. Respiratory effort normal. Cardiovascular system: S1 & S2 heard, RRR. No JVD, murmurs, rubs, gallops or clicks. No pedal edema. Gastrointestinal system: Abdomen is nondistended.  Obese and pendulous. Central nervous system: Alert and oriented. No focal neurological deficits. Extremities: Symmetric 5 x 5 power. Skin: No rashes, lesions or ulcers Psychiatry: Judgement and insight appear normal. Mood & affect appropriate.     Data Reviewed: I have personally reviewed following labs and imaging studies  CBC: Recent Labs  Lab 10/30/19 0833 11/01/19 0327 11/02/19 0045  WBC 9.2 8.8 10.4  NEUTROABS  --  1.7  --   HGB 12.1 10.9* 11.0*  HCT 40.6 35.5* 36.1  MCV 90.0 88.3 89.4  PLT 310 292 638   Basic Metabolic Panel: Recent Labs  Lab 10/26/19 0933 10/30/19 0833 10/31/19 0241 11/01/19 0327 11/02/19 0045  NA 139 136 140 139 139  K 3.7 3.9 4.5 3.8 3.9  CL 103 104 109 108 107  CO2 '21 24 22 23 25  ' GLUCOSE 81 129* 82 91 93  BUN '9 8 6 ' <5* 7  CREATININE 0.66 0.61 0.48 0.57 0.59  CALCIUM 9.2 8.5* 7.8* 8.2* 8.5*  MG  --   --   --  1.9  --   PHOS  --   --   --  3.1  --    GFR: Estimated Creatinine Clearance: 127.5 mL/min (by C-G formula based on SCr of 0.59 mg/dL). Liver Function Tests: Recent Labs  Lab 10/26/19 0933 10/30/19 1626 11/01/19 0327 11/02/19 0045  AST 111* 142* 179* 234*  ALT 207* 161* 178* 235*  ALKPHOS 100 65 61 68  BILITOT 0.4 0.7 0.7 0.6  PROT 6.9 5.9* 5.7* 6.1*  ALBUMIN 3.7* 2.6* 2.5*  2.8*   No results for input(s): LIPASE, AMYLASE in the last 168 hours. No results for input(s): AMMONIA in the last 168 hours. Coagulation Profile: No results for input(s): INR, PROTIME in the last 168 hours. Cardiac Enzymes: Recent Labs  Lab 10/26/19 9373 10/30/19 4287 10/31/19 0241 11/01/19 0327  11/02/19 0045  CKTOTAL 2,432* 10,923* 10,312* 11,117* 9,752*  CKMB  --   --  10.3*  --   --   CKMBINDEX 9.7*  --   --   --   --    BNP (last 3 results) No results for input(s): PROBNP in the last 8760 hours. HbA1C: No results for input(s): HGBA1C in the last 72 hours. CBG: No results for input(s): GLUCAP in the last 168 hours. Lipid Profile: No results for input(s): CHOL, HDL, LDLCALC, TRIG, CHOLHDL, LDLDIRECT in the last 72 hours. Thyroid Function Tests: Recent Labs    10/30/19 1626  FREET4 0.93   Anemia Panel: No results for input(s): VITAMINB12, FOLATE, FERRITIN, TIBC, IRON, RETICCTPCT in the last 72 hours. Sepsis Labs: No results for input(s): PROCALCITON, LATICACIDVEN in the last 168 hours.  Recent Results (from the past 240 hour(s))  SARS Coronavirus 2 by RT PCR (hospital order, performed in [facility] hospital lab) Nasopharyngeal Nasopharyngeal Swab     Status: None   Collection Time: 10/30/19 11:02 AM   Specimen: Nasopharyngeal Swab  Result Value Ref Range Status   SARS Coronavirus 2 NEGATIVE NEGATIVE Final    Comment: (NOTE) SARS-CoV-2 target nucleic acids are NOT DETECTED.  The SARS-CoV-2 RNA is generally detectable in upper and lower respiratory specimens during the acute phase of infection. The lowest concentration of SARS-CoV-2 viral copies this assay can detect is 250 copies / mL. A negative result does not preclude SARS-CoV-2 infection and should not be used as the sole basis for treatment or other patient management decisions.  A negative result may occur with improper specimen collection / handling, submission of specimen other than nasopharyngeal swab,  presence of viral mutation(s) within the areas targeted by this assay, and inadequate number of viral copies (<250 copies / mL). A negative result must be combined with clinical observations, patient history, and epidemiological information.  Fact Sheet for Patients:   StrictlyIdeas.no  Fact Sheet for Healthcare Providers: BankingDealers.co.za  This test is not yet approved or  cleared by the [place] FDA and has been authorized for detection and/or diagnosis of SARS-CoV-2 by FDA under an Emergency Use Authorization (EUA).  This EUA will remain in effect (meaning this test can be used) for the duration of the COVID-19 declaration under Section 564(b)(1) of the Act, 21 U.S.C. section 360bbb-3(b)(1), unless the authorization is terminated or revoked sooner.  Performed at [facility], [place] [address] [place], [place] [address]          Radiology Studies: DG Chest 2 View  Result Date: 10/31/2019 CLINICAL DATA:  Rhabdomyolysis. EXAM: CHEST - 2 VIEW COMPARISON:  No prior. FINDINGS: Mediastinum hilar structures normal. Low lung volumes. No focal infiltrate. No pleural effusion or pneumothorax. Heart size normal. Degenerative change thoracic spine. IMPRESSION: No acute cardiopulmonary disease. Electronically Signed   By: Marcello Moores  Register   On: 10/31/2019 11:35   MR FEMUR RIGHT W WO CONTRAST  Result Date: 10/31/2019 CLINICAL DATA:  Right lower extremity rhabdomyolysis EXAM: MRI OF THE RIGHT FEMUR WITHOUT AND WITH CONTRAST TECHNIQUE: Multiplanar, multisequence MR imaging of the right femur was performed both before and after administration of intravenous contrast. CONTRAST:  2m GADAVIST GADOBUTROL 1 MMOL/ML IV SOLN COMPARISON:  None. FINDINGS: Bones/Joint/Cartilage No acute fracture. No dislocation. No femoral head avascular necrosis. No bone marrow edema. No suspicious bone lesion. No significant arthropathy of the right hip or knee.  No hip or knee joint effusion. Ligaments Intact. Muscles and Tendons There is T2  hyperintense edema like signal throughout the right thigh musculature most predominantly involving the vastus lateralis, rectus femoris, vastus medialis, and distal biceps femoris muscles (series 15, image 59). Fluid signal is most prevalent within the distal vastus lateralis muscle belly (series 15, image 67) without a well-defined intramuscular fluid collection. There is geographic postcontrast enhancement of the involved portions of the affected muscles. No rim enhancement. No focal areas of non enhancement to suggest myonecrosis. There are similar findings noted to the contralateral thigh musculature seen on full field imaging (series 4, image 32). Soft tissues Mild subcutaneous edema of the visualized bilateral lower extremities. No soft tissue fluid collection. No inguinal lymphadenopathy. Small amount of free fluid within the pelvis, nonspecific and may be physiologic. IMPRESSION: 1. Diffuse intramuscular edema throughout the right thigh musculature most predominantly involving the vastus lateralis, rectus femoris, vastus medialis, and distal biceps femoris muscles with geographic areas of muscle enhancement. Findings are most compatible with the provided history of rhabdomyolysis. No specific MRI evidence to suggest myonecrosis. 2. Similar findings are visualized on included images through the left thigh. Electronically Signed   By: Davina Poke D.O.   On: 10/31/2019 13:02        Scheduled Meds:  enoxaparin (LOVENOX) injection  40 mg Subcutaneous Q24H   sodium chloride flush  3 mL Intravenous Q12H   Continuous Infusions:  sodium chloride 150 mL/hr at 11/02/19 0707     LOS: 3 days    Time spent: 25 minutes    Barb Merino, MD [facility] Pager [phone]

## 2019-11-03 ENCOUNTER — Encounter (HOSPITAL_COMMUNITY): Payer: Self-pay

## 2019-11-03 LAB — CBC
HCT: 34.5 % — ABNORMAL LOW (ref 36.0–46.0)
Hemoglobin: 10.5 g/dL — ABNORMAL LOW (ref 12.0–15.0)
MCH: 27.1 pg (ref 26.0–34.0)
MCHC: 30.4 g/dL (ref 30.0–36.0)
MCV: 88.9 fL (ref 80.0–100.0)
Platelets: 279 10*3/uL (ref 150–400)
RBC: 3.88 MIL/uL (ref 3.87–5.11)
RDW: 13.2 % (ref 11.5–15.5)
WBC: 8.6 10*3/uL (ref 4.0–10.5)
nRBC: 0.2 % (ref 0.0–0.2)

## 2019-11-03 LAB — COMPREHENSIVE METABOLIC PANEL
ALT: 256 U/L — ABNORMAL HIGH (ref 0–44)
AST: 221 U/L — ABNORMAL HIGH (ref 15–41)
Albumin: 2.7 g/dL — ABNORMAL LOW (ref 3.5–5.0)
Alkaline Phosphatase: 61 U/L (ref 38–126)
Anion gap: 7 (ref 5–15)
BUN: 7 mg/dL (ref 6–20)
CO2: 23 mmol/L (ref 22–32)
Calcium: 8.3 mg/dL — ABNORMAL LOW (ref 8.9–10.3)
Chloride: 108 mmol/L (ref 98–111)
Creatinine, Ser: 0.56 mg/dL (ref 0.44–1.00)
GFR calc Af Amer: >60 mL/min (ref >60)
GFR calc non Af Amer: >60 mL/min (ref >60)
Glucose, Bld: 88 mg/dL (ref 70–99)
Potassium: 4 mmol/L (ref 3.5–5.1)
Sodium: 138 mmol/L (ref 135–145)
Total Bilirubin: 0.6 mg/dL (ref 0.3–1.2)
Total Protein: 5.8 g/dL — ABNORMAL LOW (ref 6.5–8.1)

## 2019-11-03 LAB — CK: Total CK: 5143 U/L — ABNORMAL HIGH (ref 38–234)

## 2019-11-03 NOTE — Discharge Summary (Signed)
Physician Discharge Summary  Kim Farrell VOH:[id number] DOB: 20-Apr-1996 DOA: 10/30/2019  PCP: Lucianne Lei, MD  Admit date: 10/30/2019 Discharge date: 11/03/2019  Admitted From: Home Disposition: Home  Recommendations for Outpatient Follow-up:  1. Follow up with PCP in 1-2 weeks 2. Please obtain BMP/CBC/CK in one week 3. Follow-up with neurology as scheduled before.  Discharge Condition: Stable CODE STATUS: Full code Diet recommendation: Regular diet, adequate hydration  Discharge summary: Kim Farrell a 24 y.o.femalewith medical history significant ofof PCOS, obesity, allergic rhinitis, eczema,prediabetes, and recurrent rhabdomyolysis presented with complaints of bilateralthigh pain over the last 3 days.  Patient just recently hospitalized from6/20-6/27 for nontraumatic rhabdomyolysis. This is her fifth episode of hospitalization.  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  She was discharged after stabilization, she followed up with [facility] neurology where she had further work-up of possible causes. Review of recent labs from 7/1 found to have positive Epstein-Barr VCA and NA IgG,TSH 7.25, CRP 31, CK 2432, and CK-MB 9.7.She reports having walked up 1 flight of stairs up to her apartment, but denies any recent falls or trauma. She has been trying to keep yourself hydrated at home. This is the patient's fifth hospitalization with rhabdomyolysis since 2017, but she has never had recur so soon. She previously had muscle biopsies taken back in 2017 which showed no acute abnormality and has had extensive work-ups in the past.   Recurrent nontraumatic rhabdomyolysis -Presents with bilateral upper leg pain after walking to [employer] flight of stairs "a few times" -Exact cause is unknown.  Clinically and biochemically improving.    CKs 5000 today. Renal functions are normal.  -Previously scheduledEMG outpatient -keep this  appointment -Also has neuromuscular specialist follow-up. -Typical causes remain negative - no trauma, prolonged down time, crush injury, or prolonged immobilizations, extreme activity in the heat. -Other causes of Rhabdo remain negative:  - EBV likely nonacute as below; HIV negative - Potassium WNL - Denies alcohol use - Denies drug use - Endocrine: TSH elevated (7.2) T4 WNL  - Inflammatory: CRP/ESR minimally elevated - Autoimmune: ANA, AntiJo, dsDNA, Ro/La/ScL70 all negative; IgM isolated and elevated alone - No seizure history/hyperkinetic states - No known sickle cell trait/disorder             - she works in a warehouse since long time.  Today, she is asymptomatic.  Her CK level is a still 5000, however her renal function is normal and probably has chronically elevated CK levels.  She wants to go home.  She was advised to drink plenty of fluid and keep hydrated.  She was advised to avoid heavy exertion.  She will follow-up with neurology as scheduled.  Discharge Diagnoses:  Principal Problem:   Non-traumatic rhabdomyolysis Active Problems:   Prediabetes   Elevated TSH   Obesity (BMI 30-39.9)    Discharge Instructions  Discharge Instructions    Call MD for:  severe uncontrolled pain   Complete by: As directed    Diet general   Complete by: As directed    Discharge instructions   Complete by: As directed    Drink plenty of water  Keep your appointment with your neurology provider Can go back to work   Increase activity slowly   Complete by: As directed      Allergies as of 11/03/2019      Reactions   Cantaloupe (diagnostic) Swelling   lips   Other Other (See Comments)   Cardic DM (a  cough medicine) turns red   Scallops [shellfish Allergy] Swelling   Lip swelling      Medication List    TAKE these medications   ibuprofen 200 MG tablet Commonly known as:  ADVIL Take 400 mg by mouth as needed for fever, headache or mild pain.       Allergies  Allergen Reactions  . Cantaloupe (Diagnostic) Swelling    lips  . Other Other (See Comments)    Cardic DM (a cough medicine) turns red   . Scallops [Shellfish Allergy] Swelling    Lip swelling    Consultations:  None   Procedures/Studies: DG Chest 2 View  Result Date: 10/31/2019 CLINICAL DATA:  Rhabdomyolysis. EXAM: CHEST - 2 VIEW COMPARISON:  No prior. FINDINGS: Mediastinum hilar structures normal. Low lung volumes. No focal infiltrate. No pleural effusion or pneumothorax. Heart size normal. Degenerative change thoracic spine. IMPRESSION: No acute cardiopulmonary disease. Electronically Signed   By: Marcello Moores  Register   On: 10/31/2019 11:35   MR FEMUR RIGHT W WO CONTRAST  Result Date: 10/31/2019 CLINICAL DATA:  Right lower extremity rhabdomyolysis EXAM: MRI OF THE RIGHT FEMUR WITHOUT AND WITH CONTRAST TECHNIQUE: Multiplanar, multisequence MR imaging of the right femur was performed both before and after administration of intravenous contrast. CONTRAST:  23m GADAVIST GADOBUTROL 1 MMOL/ML IV SOLN COMPARISON:  None. FINDINGS: Bones/Joint/Cartilage No acute fracture. No dislocation. No femoral head avascular necrosis. No bone marrow edema. No suspicious bone lesion. No significant arthropathy of the right hip or knee. No hip or knee joint effusion. Ligaments Intact. Muscles and Tendons There is T2 hyperintense edema like signal throughout the right thigh musculature most predominantly involving the vastus lateralis, rectus femoris, vastus medialis, and distal biceps femoris muscles (series 15, image 59). Fluid signal is most prevalent within the distal vastus lateralis muscle belly (series 15, image 67) without a well-defined intramuscular fluid collection. There is geographic postcontrast enhancement of the involved portions of the affected muscles. No rim enhancement. No focal areas of non enhancement to  suggest myonecrosis. There are similar findings noted to the contralateral thigh musculature seen on full field imaging (series 4, image 32). Soft tissues Mild subcutaneous edema of the visualized bilateral lower extremities. No soft tissue fluid collection. No inguinal lymphadenopathy. Small amount of free fluid within the pelvis, nonspecific and may be physiologic. IMPRESSION: 1. Diffuse intramuscular edema throughout the right thigh musculature most predominantly involving the vastus lateralis, rectus femoris, vastus medialis, and distal biceps femoris muscles with geographic areas of muscle enhancement. Findings are most compatible with the provided history of rhabdomyolysis. No specific MRI evidence to suggest myonecrosis. 2. Similar findings are visualized on included images through the left thigh. Electronically Signed   By: NDavina PokeD.O.   On: 10/31/2019 13:02   UKoreaAbdomen Limited RUQ  Result Date: 10/19/2019 CLINICAL DATA:  24year old female with elevated LFTs. EXAM: ULTRASOUND ABDOMEN LIMITED RIGHT UPPER QUADRANT COMPARISON:  CT abdomen pelvis dated 04/04/2016. FINDINGS: Gallbladder: No gallstones or wall thickening visualized. No sonographic Murphy sign noted by sonographer. Common bile duct: Diameter: 3 mm Liver: There is diffuse increased liver echogenicity most commonly seen in the setting of fatty infiltration. Superimposed inflammation or fibrosis is not excluded. Clinical correlation is recommended. Portal vein is patent on color Doppler imaging with normal direction of blood flow towards the liver. Other: None. IMPRESSION: Fatty liver, otherwise unremarkable right upper quadrant ultrasound. Electronically Signed   By: AAnner CreteM.D.   On: 10/19/2019 19:57    (Echo, Carotid,  EGD, Colonoscopy, ERCP)    Subjective: Patient seen and examined.  She was up about and walking in the room.  She is ready to go home.  Denies any symptoms today.   Discharge Exam: Vitals:    11/02/19 2105 11/03/19 0503  BP: 108/71 110/69  Pulse: 70 72  Resp: 18 17  Temp: 98.2 F (36.8 C) 98 F (36.7 C)  SpO2: 100% 99%   Vitals:   11/01/19 2015 11/02/19 0440 11/02/19 2105 11/03/19 0503  BP: (!) 113/57 106/72 108/71 110/69  Pulse: 81 68 70 72  Resp: _0 Temp: 99.5 F (37.5 C) 98.4 F (36.9 C) 98.2 F (36.8 C) 98 F (36.7 C)  TempSrc: Oral Oral Oral Oral  SpO2: 100% 100% 100% 99%    General: Pt is alert, awake, not in acute distress Cardiovascular: RRR, S1/S2 +, no rubs, no gallops Respiratory: CTA bilaterally, no wheezing, no rhonchi Abdominal: Soft, NT, ND, bowel sounds + Extremities: no edema, no cyanosis    The results of significant diagnostics from this hospitalization (including imaging, microbiology, ancillary and laboratory) are listed below for reference.     Microbiology: Recent Results (from the past 240 hour(s))  SARS Coronavirus 2 by RT PCR (hospital order, performed in [facility] hospital lab) Nasopharyngeal Nasopharyngeal Swab     Status: None   Collection Time: 10/30/19 11:02 AM   Specimen: Nasopharyngeal Swab  Result Value Ref Range Status   SARS Coronavirus 2 NEGATIVE NEGATIVE Final    Comment: (NOTE) SARS-CoV-2 target nucleic acids are NOT DETECTED.  The SARS-CoV-2 RNA is generally detectable in upper and lower respiratory specimens during the acute phase of infection. The lowest concentration of SARS-CoV-2 viral copies this assay can detect is 250 copies / mL. A negative result does not preclude SARS-CoV-2 infection and should not be used as the sole basis for treatment or other patient management decisions.  A negative result may occur with improper specimen collection / handling, submission of specimen other than nasopharyngeal swab, presence of viral mutation(s) within the areas targeted by this assay, and inadequate number of viral copies (<250 copies / mL). A negative result must be combined with  clinical observations, patient history, and epidemiological information.  Fact Sheet for Patients:   StrictlyIdeas.no  Fact Sheet for Healthcare Providers: BankingDealers.co.za  This test is not yet approved or  cleared by the [place] FDA and has been authorized for detection and/or diagnosis of SARS-CoV-2 by FDA under an Emergency Use Authorization (EUA).  This EUA will remain in effect (meaning this test can be used) for the duration of the COVID-19 declaration under Section 564(b)(1) of the Act, 21 U.S.C. section 360bbb-3(b)(1), unless the authorization is terminated or revoked sooner.  Performed at [facility], [place] [address] [place], [place] [address]      Labs: BNP (last 3 results) No results for input(s): BNP in the last 8760 hours. Basic Metabolic Panel: Recent Labs  Lab 10/30/19 0833 10/31/19 0241 11/01/19 0327 11/02/19 0045 11/03/19 0416  NA 136 140 139 139 138  K 3.9 4.5 3.8 3.9 4.0  CL 104 109 108 107 108  CO2 _1 GLUCOSE 129* 82 91 93 88  BUN 8 6 <5* 7 7  CREATININE 0.61 0.48 0.57 0.59 0.56  CALCIUM 8.5* 7.8* 8.2* 8.5* 8.3*  MG  --   --  1.9  --   --   PHOS  --   --  3.1  --   --  Liver Function Tests: Recent Labs  Lab 10/30/19 1626 11/01/19 0327 11/02/19 0045 11/03/19 0416  AST 142* 179* 234* 221*  ALT 161* 178* 235* 256*  ALKPHOS 65 61 68 61  BILITOT 0.7 0.7 0.6 0.6  PROT 5.9* 5.7* 6.1* 5.8*  ALBUMIN 2.6* 2.5* 2.8* 2.7*   No results for input(s): LIPASE, AMYLASE in the last 168 hours. No results for input(s): AMMONIA in the last 168 hours. CBC: Recent Labs  Lab 10/30/19 0833 11/01/19 0327 11/02/19 0045 11/03/19 0416  WBC 9.2 8.8 10.4 8.6  NEUTROABS  --  1.7  --   --   HGB 12.1 10.9* 11.0* 10.5*  HCT 40.6 35.5* 36.1 34.5*  MCV 90.0 88.3 89.4 88.9  PLT 310 292 322 279   Cardiac Enzymes: Recent Labs  Lab 10/30/19 0833 10/31/19 0241 11/01/19 0327  11/02/19 0045 11/03/19 0416  CKTOTAL 10,923* 10,312* 11,117* 9,752* 5,143*  CKMB  --  10.3*  --   --   --    BNP: Invalid input(s): POCBNP CBG: No results for input(s): GLUCAP in the last 168 hours. D-Dimer No results for input(s): DDIMER in the last 72 hours. Hgb A1c No results for input(s): HGBA1C in the last 72 hours. Lipid Profile No results for input(s): CHOL, HDL, LDLCALC, TRIG, CHOLHDL, LDLDIRECT in the last 72 hours. Thyroid function studies No results for input(s): TSH, T4TOTAL, T3FREE, THYROIDAB in the last 72 hours.  Invalid input(s): FREET3 Anemia work up No results for input(s): VITAMINB12, FOLATE, FERRITIN, TIBC, IRON, RETICCTPCT in the last 72 hours. Urinalysis    Component Value Date/Time   COLORURINE YELLOW 10/30/2019 1018   APPEARANCEUR CLEAR 10/30/2019 1018   LABSPEC 1.012 10/30/2019 1018   PHURINE 5.0 10/30/2019 1018   GLUCOSEU NEGATIVE 10/30/2019 1018   HGBUR NEGATIVE 10/30/2019 1018   BILIRUBINUR NEGATIVE 10/30/2019 1018   KETONESUR NEGATIVE 10/30/2019 1018   PROTEINUR NEGATIVE 10/30/2019 1018   NITRITE NEGATIVE 10/30/2019 1018   LEUKOCYTESUR NEGATIVE 10/30/2019 1018   Sepsis Labs Invalid input(s): PROCALCITONIN,  WBC,  LACTICIDVEN Microbiology Recent Results (from the past 240 hour(s))  SARS Coronavirus 2 by RT PCR (hospital order, performed in [place] hospital lab) Nasopharyngeal Nasopharyngeal Swab     Status: None   Collection Time: 10/30/19 11:02 AM   Specimen: Nasopharyngeal Swab  Result Value Ref Range Status   SARS Coronavirus 2 NEGATIVE NEGATIVE Final    Comment: (NOTE) SARS-CoV-2 target nucleic acids are NOT DETECTED.  The SARS-CoV-2 RNA is generally detectable in upper and lower respiratory specimens during the acute phase of infection. The lowest concentration of SARS-CoV-2 viral copies this assay can detect is 250 copies / mL. A negative result does not preclude SARS-CoV-2 infection and should not be used as the sole basis  for treatment or other patient management decisions.  A negative result may occur with improper specimen collection / handling, submission of specimen other than nasopharyngeal swab, presence of viral mutation(s) within the areas targeted by this assay, and inadequate number of viral copies (<250 copies / mL). A negative result must be combined with clinical observations, patient history, and epidemiological information.  Fact Sheet for Patients:   StrictlyIdeas.no  Fact Sheet for Healthcare Providers: BankingDealers.co.za  This test is not yet approved or  cleared by the [place] FDA and has been authorized for detection and/or diagnosis of SARS-CoV-2 by FDA under an Emergency Use Authorization (EUA).  This EUA will remain in effect (meaning this test can be used) for the duration of the COVID-19  declaration under Section 564(b)(1) of the Act, 21 U.S.C. section 360bbb-3(b)(1), unless the authorization is terminated or revoked sooner.  Performed at [facility], [place] [address], [place], [place] [address]      Time coordinating discharge:  32 minutes  SIGNED:   Barb Merino, MD  [facility] 11/03/2019, 9:54 AM

## 2019-11-03 NOTE — Progress Notes (Signed)
[name] to be D/C'd per MD order. Discussed with the patient and all questions fully answered. ? VSS, Skin clean, dry and intact without evidence of skin break down, no evidence of skin tears noted. ? IV catheter discontinued intact. Site without signs and symptoms of complications. Dressing and pressure applied. ? An After Visit Summary was printed and given to the patient. Patient informed where to pickup prescriptions. ? D/C education completed with patient/family including follow up instructions, medication list, d/c activities limitations if indicated, with other d/c instructions as indicated by MD - patient able to verbalize understanding, all questions fully answered.  ? Patient instructed to return to ED, call 911, or call MD for any changes in condition.  ? Patient to be escorted via WC, and D/C home via private auto with mother.

## 2019-11-30 ENCOUNTER — Other Ambulatory Visit: Payer: Self-pay

## 2019-11-30 ENCOUNTER — Encounter (INDEPENDENT_AMBULATORY_CARE_PROVIDER_SITE_OTHER): Payer: PRIVATE HEALTH INSURANCE | Admitting: Diagnostic Neuroimaging

## 2019-11-30 ENCOUNTER — Ambulatory Visit (INDEPENDENT_AMBULATORY_CARE_PROVIDER_SITE_OTHER): Payer: PRIVATE HEALTH INSURANCE | Admitting: Diagnostic Neuroimaging

## 2019-11-30 DIAGNOSIS — Z0289 Encounter for other administrative examinations: Secondary | ICD-10-CM

## 2019-11-30 DIAGNOSIS — M60009 Infective myositis, unspecified site: Secondary | ICD-10-CM

## 2019-11-30 DIAGNOSIS — M6282 Rhabdomyolysis: Secondary | ICD-10-CM

## 2019-11-30 DIAGNOSIS — R748 Abnormal levels of other serum enzymes: Secondary | ICD-10-CM

## 2019-11-30 NOTE — Procedures (Signed)
[facility]  NCS (NERVE CONDUCTION STUDY) WITH EMG (ELECTROMYOGRAPHY) REPORT   STUDY DATE: 11/30/19 PATIENT NAME: Kim Farrell DOB: [birth year] MRN: [id number]  ORDERING CLINICIAN: Huston Foley, MD PhD   TECHNOLOGIST: Durenda Age ELECTROMYOGRAPHER: Glenford Bayley. Jeyli Zwicker, MD  CLINICAL INFORMATION: 24 year old female with myalgia, cramps, rhabdomyolysis.  FINDINGS: NERVE CONDUCTION STUDY: Bilateral peroneal and tibial motor responses are normal.  Bilateral sural and superficial peroneal sensory responses are normal.  Bilateral tibial F wave latencies are normal.   NEEDLE ELECTROMYOGRAPHY: Needle examination of left lower extremity vastus medialis, tibialis anterior, gastrocnemius shows early recruitment of small polyphasic motor units on exertion.  No abnormal spontaneous activity at rest.   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 -No abnormal spontaneous activity at rest. -No large fiber neuropathy.    INTERPRETING PHYSICIAN:  Suanne Marker, MD Certified in Neurology, Neurophysiology and Neuroimaging  [facility] [address] [place], [place] [address] [phone]  [facility]    Nerve / Sites Muscle Latency Ref. Amplitude Ref. Rel Amp Segments Distance Velocity Ref. Area    ms ms mV mV %  cm m/s m/s mVms  R Peroneal - EDB     Ankle EDB 4.8 ?6.5 5.5 ?2.0 100 Ankle - EDB 9   16.6     Fib head EDB 10.2  5.0  91.9 Fib head - Ankle 28 51 ?44 16.2     Pop fossa EDB 12.2  4.8  95.8 Pop fossa - Fib head 10 51 ?44 15.6         Pop fossa - Ankle      L Peroneal - EDB     Ankle EDB 4.5 ?6.5 6.8 ?2.0 100 Ankle - EDB 9   18.7     Fib head EDB 9.9  6.3  93 Fib head - Ankle 27 49 ?44 18.9     Pop fossa EDB 11.8  6.2  97.5 Pop fossa - Fib head 10 55 ?44 18.4         Pop fossa - Ankle      R Tibial - AH     Ankle AH 5.4 ?5.8  5.0 ?4.0 100 Ankle - AH 9   10.9     Pop fossa AH 12.6  4.1  81.8 Pop fossa - Ankle 37 51 ?41 7.7  L Tibial - AH     Ankle AH 4.0 ?5.8 8.1 ?4.0 100 Ankle - AH 9   15.7     Pop fossa AH 11.3  5.1  62.5 Pop fossa - Ankle 35 48 ?41 13.6             SNC    Nerve / Sites Rec. Site Peak Lat Ref.  Amp Ref. Segments Distance    ms ms V V  cm  R Sural - Ankle (Calf)     Calf Ankle 3.3 ?4.4 18 ?6 Calf - Ankle 14  L Sural - Ankle (Calf)     Calf Ankle 3.2 ?4.4 17 ?6 Calf - Ankle 14  R Superficial peroneal - Ankle     Lat leg Ankle 3.3 ?4.4 13 ?6 Lat leg - Ankle 14  L Superficial peroneal - Ankle     Lat leg Ankle 3.3 ?4.4 12 ?6 Lat leg - Ankle 14             F  Wave    Nerve F Lat  Ref.   ms ms  R Tibial - AH 51.1 ?56.0  L Tibial - AH 48.2 ?56.0         EMG Summary Table    Spontaneous MUAP Recruitment  Muscle IA Fib PSW Fasc Other Amp Dur. Poly Pattern  L. Vastus medialis Normal None None None _______ Decreased Normal 1+ Early  L. Tibialis anterior Normal None None None _______ Decreased Normal 1+ Early  L. Gastrocnemius (Medial head) Normal None None None _______ Decreased Normal 1+ Early

## 2019-12-05 ENCOUNTER — Telehealth (INDEPENDENT_AMBULATORY_CARE_PROVIDER_SITE_OTHER): Payer: PRIVATE HEALTH INSURANCE | Admitting: Internal Medicine

## 2019-12-05 ENCOUNTER — Encounter: Payer: Self-pay | Admitting: Internal Medicine

## 2019-12-05 ENCOUNTER — Other Ambulatory Visit (HOSPITAL_COMMUNITY): Payer: Self-pay | Admitting: Internal Medicine

## 2019-12-05 DIAGNOSIS — Z7689 Persons encountering health services in other specified circumstances: Secondary | ICD-10-CM | POA: Diagnosis not present

## 2019-12-05 DIAGNOSIS — R7989 Other specified abnormal findings of blood chemistry: Secondary | ICD-10-CM | POA: Diagnosis not present

## 2019-12-05 DIAGNOSIS — R7303 Prediabetes: Secondary | ICD-10-CM

## 2019-12-05 DIAGNOSIS — Z30011 Encounter for initial prescription of contraceptive pills: Secondary | ICD-10-CM

## 2019-12-05 DIAGNOSIS — Z1159 Encounter for screening for other viral diseases: Secondary | ICD-10-CM

## 2019-12-05 MED ORDER — NORETHIN ACE-ETH ESTRAD-FE 1-20 MG-MCG(24) PO TABS: 1.0000 | ORAL_TABLET | Freq: Every day | ORAL | 11 refills | Status: DC

## 2019-12-05 MED FILL — TARINA 24 FE 1-20 MG-MCG(24: 1-20 | 28 days supply | Qty: 28 | Fill #0

## 2019-12-05 NOTE — Patient Instructions (Signed)
How to take oral contraceptive pills Options to start taking your first cycle of OCPs: 1. Start the pill on day 1 of your menstrual period. If you start at this time, you will not need any backup form of birth control (contraception), such as condoms. 2. Start the pill on the first Sunday after your menstrual period or on the day you get your prescription. In these cases, you will need to use backup contraception for the first week. 3. Start the pill at any time of your cycle. ? If you take the pill within 5 days of the start of your period, you will not need a backup form of contraception. ? If you start at any other time of your menstrual cycle, you will need to use another form of contraception for 7 days. If your OCP is the type called a minipill, it will protect you from pregnancy after taking it for 2 days (48 hours), and you can stop using backup contraception after that time. After you have started taking OCPs:  If you forget to take 1 pill, take it as soon as you remember. Take the next pill at the regular time.  If you miss 2 or more pills, call your health care provider. Different pills have different instructions for missed doses. Use backup birth control until your next menstrual period starts.  If you use a 28-day pack that contains inactive pills and you miss 1 of the last 7 pills (pills with no hormones), throw away the rest of the non-hormone pills and start a new pill pack. No matter which day you start the OCP, you will always start a new pack on that same day of the week. Have an extra pack of OCPs and a backup contraceptive method available in case you miss some pills or lose your OCP pack. 

## 2019-12-05 NOTE — Progress Notes (Signed)
Virtual Visit via Telephone Note  I connected with Kim Farrell, on 12/05/2019 at 10:46 AM by telephone due to the COVID-19 pandemic and verified that I am speaking with the correct person using two identifiers.   Consent: I discussed the limitations, risks, security and privacy concerns of performing an evaluation and management service by telephone and the availability of in person appointments. I also discussed with the patient that there may be a patient responsible charge related to this service. The patient expressed understanding and agreed to proceed.   Location of Patient: Home   Location of Provider: Clinic    Persons participating in Telemedicine visit: Elania Alethea Terhaar [facility] Dr. Earlene Plater      History of Present Illness: Patient has a visit to establish care. She has a history of recurrent rhabdomyolysis. Was hospitalized twice for this within the past couple months. Has had a muscle biopsy procedure done. Followed by neurology. Not currently taking any medications.   Patient was in the prediabetic range with A1c. Grandmother is prediabetic; otherwise no history. She also has a history of PCOS and asks if carb modified diet and exercise will help her lose weight with PCOS.    Past Medical History:  Diagnosis Date  . Allergy    pollen, scallops, cantelope,   . Eczema   . 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 Obesity   . PCOS (polycystic ovarian syndrome) 2017   pt self reported as reason for being on metformin  . Pre-diabetes   . Rhabdomyolysis   . Rhabdomyolysis 03/2016  . Rhabdomyolysis    Allergies  Allergen Reactions  . Cantaloupe (Diagnostic) Swelling    lips  . Other Other (See Comments)    Cardic DM (a cough medicine) turns red   . Scallops [Shellfish Allergy] Swelling    Lip swelling    Current Outpatient Medications on File Prior to Visit  Medication Sig Dispense Refill  . ibuprofen (ADVIL) 200 MG  tablet Take 400 mg by mouth as needed for fever, headache or mild pain.      No current facility-administered medications on file prior to visit.    Observations/Objective: NAD. Speaking clearly.  Work of breathing normal.  Alert and oriented. Mood appropriate.   Assessment and Plan: 1. Encounter to establish care Reviewed patient's PMH, social history, surgical history, and medications.  Is overdue for annual exam, screening blood work, and health maintenance topics. Have asked patient to return for visit to address these items.  Will need to obtain records from recent PAP.   2. Abnormal TSH TSH mildly elevated to 7. May be related to recent illness. Will repeat with T3/T4.  - TSH+T4F+T3Free; Future  3. Prediabetes History of newly diagnosed prediabetes with result of 5.9 in July 2021. Counseled on carb modified diet and exercise. Could consider Metformin as therapy especially given history of PCOS.   4. Need for hepatitis C screening test - HCV Ab w/Rflx to Verification; Future  5. Encounter for initial prescription of contraceptive pills Denies possibility of pregnancy. Asks for OCP to regulate menstrual cycles with history of PCOS. Counseled on how to start Rx and need for back up method. Not current smoker. No history of migraine with aura, HTN, or VTE.  - Norethindrone Acetate-Ethinyl Estrad-FE (LOESTRIN 24 FE) 1-20 MG-MCG(24) tablet; Take 1 tablet by mouth daily.  Dispense: 28 tablet; Refill: 11   Follow Up Instructions: Labs 8/12, Annual exam    I discussed the assessment  and treatment plan with the patient. The patient was provided an opportunity to ask questions and all were answered. The patient agreed with the plan and demonstrated an understanding of the instructions.   The patient was advised to call back or seek an in-person evaluation if the symptoms worsen or if the condition fails to improve as anticipated.     I provided 24 minutes total of non-face-to-face  time during this encounter including median intraservice time, reviewing previous notes, investigations, ordering medications, medical decision making, coordinating care and patient verbalized understanding at the end of the visit.    Marcy Siren, D.O. Primary Care at [facility]  12/05/2019, 10:46 AM

## 2019-12-07 ENCOUNTER — Other Ambulatory Visit (INDEPENDENT_AMBULATORY_CARE_PROVIDER_SITE_OTHER): Payer: PRIVATE HEALTH INSURANCE

## 2019-12-07 ENCOUNTER — Other Ambulatory Visit: Payer: Self-pay

## 2019-12-07 DIAGNOSIS — R7989 Other specified abnormal findings of blood chemistry: Secondary | ICD-10-CM

## 2019-12-07 DIAGNOSIS — Z1159 Encounter for screening for other viral diseases: Secondary | ICD-10-CM | POA: Diagnosis not present

## 2019-12-08 LAB — TSH+T4F+T3FREE
Free T4: 1.05 ng/dL (ref 0.82–1.77)
T3, Free: 3.2 pg/mL (ref 2.0–4.4)
TSH: 5.8 u[IU]/mL — ABNORMAL HIGH (ref 0.450–4.500)

## 2019-12-08 LAB — HCV INTERPRETATION

## 2019-12-08 LAB — HCV AB W/RFLX TO VERIFICATION: HCV Ab: <0.1 s/co ratio (ref 0.0–0.9)

## 2020-01-02 MED FILL — TARINA 24 FE 1-20 MG-MCG(24: 1-20 | 28 days supply | Qty: 28 | Fill #1

## 2020-01-03 NOTE — Patient Instructions (Signed)

## 2020-01-04 ENCOUNTER — Encounter: Payer: Self-pay | Admitting: Internal Medicine

## 2020-01-04 ENCOUNTER — Other Ambulatory Visit: Payer: Self-pay

## 2020-01-04 ENCOUNTER — Ambulatory Visit (INDEPENDENT_AMBULATORY_CARE_PROVIDER_SITE_OTHER): Payer: PRIVATE HEALTH INSURANCE | Admitting: Internal Medicine

## 2020-01-04 VITALS — BP 121/79 | HR 64 | Temp 97.5°F | Resp 17 | Ht 63.0 in | Wt 208.0 lb

## 2020-01-04 DIAGNOSIS — Z23 Encounter for immunization: Secondary | ICD-10-CM | POA: Diagnosis not present

## 2020-01-04 DIAGNOSIS — Z0000 Encounter for general adult medical examination without abnormal findings: Secondary | ICD-10-CM

## 2020-01-04 NOTE — Progress Notes (Signed)
Subjective:    Kim Farrell - 24 y.o. female MRN [id number]  Date of birth: 1995-07-31  HPI  Kim Farrell is here for annual exam. No concerns today. Doing well with recently prescribed OCP. We discussed how to take it, the active and non-active pills, etc.   Has been COVID vaccinated. Discussed about booster vaccine.   Declines STD testing.   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 This is in [employer]. She lives in [place].      Health Maintenance:  Health Maintenance Due  Topic Date Due   COVID-19 Vaccine (1) Never done   TETANUS/TDAP  12/19/2018   PAP-Cervical Cytology Screening  Never done   PAP SMEAR-Modifier  Never done   INFLUENZA VACCINE  11/26/2019    -  reports that she has never smoked. She has never used smokeless tobacco. - Review of Systems: Per HPI. - Past Medical History: Patient Active Problem List   Diagnosis Date Noted   Elevated TSH 10/30/2019   Obesity (BMI 30-39.9) 10/30/2019   Rhabdomyolysis 10/15/2019   Elevated transaminase level 10/15/2019   GERD (gastroesophageal reflux disease) 04/09/2016   Morbid obesity (HCC) 04/09/2016   H/O multiple allergies 04/09/2016   Prediabetes    Non-traumatic rhabdomyolysis    - Medications: reviewed and updated   Objective:   Physical Exam BP 121/79    Pulse 64    Temp (!) 97.5 F (36.4 C) (Temporal)    Resp 17    Ht 5\' 3"  (1.6 m)    Wt 208 lb (94.3 kg)    LMP 01/04/2020    SpO2 98%    BMI 36.85 kg/m  Physical Exam Constitutional:      Appearance: She is not diaphoretic.  HENT:     Head: Normocephalic and atraumatic.  Eyes:     Conjunctiva/sclera: Conjunctivae normal.     Pupils: Pupils are equal, round, and reactive to light.  Neck:     Thyroid: No thyromegaly.  Cardiovascular:     Rate and Rhythm: Normal rate and regular rhythm.     Heart sounds: Normal heart sounds. No murmur heard.   Pulmonary:     Effort:  Pulmonary effort is normal. No respiratory distress.     Breath sounds: Normal breath sounds. No wheezing.  Abdominal:     General: Bowel sounds are normal. There is no distension.     Palpations: Abdomen is soft.     Tenderness: There is no abdominal tenderness. There is no guarding or rebound.  Musculoskeletal:        General: No deformity. Normal range of motion.     Cervical back: Normal range of motion and neck supple.  Lymphadenopathy:     Cervical: No cervical adenopathy.  Skin:    General: Skin is warm and dry.     Findings: No rash.  Neurological:     Mental Status: She is alert and oriented to person, place, and time.     Gait: Gait is intact.  Psychiatric:        Mood and Affect: Mood and affect normal.        Judgment: Judgment normal.            Assessment & Plan:   1. Annual physical exam Counseled on 150 minutes of exercise per week, healthy eating (including decreased daily intake of saturated fats, cholesterol, added sugars, sodium), STI prevention, routine healthcare maintenance. Return for PAP due to menstrual cycle.  -  CBC with Differential - Comprehensive metabolic panel  2. Needs flu shot Influenza vaccine given.   3. Need for Tdap vaccination Tdap vaccine given.       Marcy Siren, D.O. 01/04/2020, 2:51 PM Primary Care at [facility]

## 2020-01-05 LAB — COMPREHENSIVE METABOLIC PANEL
ALT: 14 IU/L (ref 0–32)
AST: 13 IU/L (ref 0–40)
Albumin/Globulin Ratio: 1.4 (ref 1.2–2.2)
Albumin: 4.1 g/dL (ref 3.9–5.0)
Alkaline Phosphatase: 60 IU/L (ref 48–121)
BUN/Creatinine Ratio: 16 (ref 9–23)
BUN: 10 mg/dL (ref 6–20)
Bilirubin Total: 0.3 mg/dL (ref 0.0–1.2)
CO2: 22 mmol/L (ref 20–29)
Calcium: 9.1 mg/dL (ref 8.7–10.2)
Chloride: 107 mmol/L — ABNORMAL HIGH (ref 96–106)
Creatinine, Ser: 0.64 mg/dL (ref 0.57–1.00)
GFR calc Af Amer: 148 mL/min/{1.73_m2} (ref >59)
GFR calc non Af Amer: 128 mL/min/{1.73_m2} (ref >59)
Globulin, Total: 2.9 g/dL (ref 1.5–4.5)
Glucose: 89 mg/dL (ref 65–99)
Potassium: 4.1 mmol/L (ref 3.5–5.2)
Sodium: 139 mmol/L (ref 134–144)
Total Protein: 7 g/dL (ref 6.0–8.5)

## 2020-01-05 LAB — CBC WITH DIFFERENTIAL/PLATELET
Basophils Absolute: 0 10*3/uL (ref 0.0–0.2)
Basos: 0 %
EOS (ABSOLUTE): 0.1 10*3/uL (ref 0.0–0.4)
Eos: 2 %
Hematocrit: 38 % (ref 34.0–46.6)
Hemoglobin: 12.6 g/dL (ref 11.1–15.9)
Immature Grans (Abs): 0 10*3/uL (ref 0.0–0.1)
Immature Granulocytes: 0 %
Lymphocytes Absolute: 2.6 10*3/uL (ref 0.7–3.1)
Lymphs: 51 %
MCH: 27.5 pg (ref 26.6–33.0)
MCHC: 33.2 g/dL (ref 31.5–35.7)
MCV: 83 fL (ref 79–97)
Monocytes Absolute: 0.4 10*3/uL (ref 0.1–0.9)
Monocytes: 8 %
Neutrophils Absolute: 2 10*3/uL (ref 1.4–7.0)
Neutrophils: 39 %
Platelets: 282 10*3/uL (ref 150–450)
RBC: 4.59 x10E6/uL (ref 3.77–5.28)
RDW: 13.4 % (ref 11.7–15.4)
WBC: 5.1 10*3/uL (ref 3.4–10.8)

## 2020-02-12 MED FILL — TARINA 24 FE 1-20 MG-MCG(24: 1-20 | 28 days supply | Qty: 28 | Fill #2

## 2020-02-13 ENCOUNTER — Ambulatory Visit: Payer: PRIVATE HEALTH INSURANCE | Admitting: Adult Health

## 2020-02-13 ENCOUNTER — Encounter: Payer: Self-pay | Admitting: Adult Health

## 2020-03-06 ENCOUNTER — Ambulatory Visit: Payer: PRIVATE HEALTH INSURANCE | Admitting: Internal Medicine

## 2020-03-06 DIAGNOSIS — Z124 Encounter for screening for malignant neoplasm of cervix: Secondary | ICD-10-CM

## 2020-03-06 DIAGNOSIS — Z113 Encounter for screening for infections with a predominantly sexual mode of transmission: Secondary | ICD-10-CM

## 2020-03-14 MED FILL — TARINA 24 FE 1-20 MG-MCG(24: 1-20 | 28 days supply | Qty: 28 | Fill #3

## 2020-03-28 ENCOUNTER — Other Ambulatory Visit: Payer: Self-pay

## 2020-03-28 ENCOUNTER — Encounter: Payer: Self-pay | Admitting: Emergency Medicine

## 2020-03-28 ENCOUNTER — Ambulatory Visit
Admission: EM | Admit: 2020-03-28 | Discharge: 2020-03-28 | Disposition: A | Discharge status: Home / Self Care | Payer: No Typology Code available for payment source | Attending: Emergency Medicine | Admitting: Emergency Medicine

## 2020-03-28 ENCOUNTER — Other Ambulatory Visit (HOSPITAL_COMMUNITY): Payer: Self-pay | Admitting: Emergency Medicine

## 2020-03-28 DIAGNOSIS — J029 Acute pharyngitis, unspecified: Secondary | ICD-10-CM | POA: Diagnosis not present

## 2020-03-28 LAB — POCT RAPID STREP A (OFFICE): Rapid Strep A Screen: NEGATIVE

## 2020-03-28 MED ORDER — CETIRIZINE HCL 10 MG PO CAPS
10.0000 mg | ORAL_CAPSULE | Freq: Every day | ORAL | 0 refills | Status: DC
Start: 2020-03-28 — End: 2020-05-07

## 2020-03-28 MED ORDER — BENZONATATE 200 MG PO CAPS
200.0000 mg | ORAL_CAPSULE | Freq: Three times a day (TID) | ORAL | 0 refills | Status: AC | PRN
Start: 2020-03-28 — End: 2020-04-04

## 2020-03-28 MED FILL — BENZONATATE 200 MG CAP: 200 | 9 days supply | Qty: 28 | Fill #0

## 2020-03-28 NOTE — ED Provider Notes (Signed)
[facility]    CSN: [id number] Arrival date & time: 03/28/20  1018      History   Chief Complaint Chief Complaint  Patient presents with   Sore Throat    HPI Kim Farrell is a 24 y.o. female presenting today for evaluation of a sore throat.  Patient reports that she has had sensation of food getting stuck in her throat.  Reports that she has had mucus and drainage in her throat and associated discomfort with swallowing.  Mainly discomfort with swallowing solids, less discomfort with liquids.  Denies history of similar.  Symptoms have been going on for 3 days.  Has had associated mild cough.  Denies rhinorrhea or sinus pressure.  Denies fevers chills or body aches.  Denies any nausea vomiting or abdominal pain.  Denies history of GERD.  HPI  Past Medical History:  Diagnosis Date   Allergy    pollen, scallops, cantelope,    Eczema    Neuromuscular disorder (HCC)    Phreesia 12/03/2019   Obesity    PCOS (polycystic ovarian syndrome) 2017   pt self reported as reason for being on metformin   Pre-diabetes    Rhabdomyolysis    Rhabdomyolysis 03/2016   Rhabdomyolysis     Patient Active Problem List   Diagnosis Date Noted   Elevated TSH 10/30/2019   Obesity (BMI 30-39.9) 10/30/2019   Rhabdomyolysis 10/15/2019   Elevated transaminase level 10/15/2019   GERD (gastroesophageal reflux disease) 04/09/2016   Morbid obesity (HCC) 04/09/2016   H/O multiple allergies 04/09/2016   Prediabetes    Non-traumatic rhabdomyolysis     Past Surgical History:  Procedure Laterality Date   ADENOIDECTOMY  2015   MUSCLE BIOPSY Right 04/14/2016   Procedure: MUSCLE BIOPSY;  Surgeon: Berna Bue, MD;  Location: [facility] OR;  Service: General;  Laterality: Right;   TONSILLECTOMY      OB History   No obstetric history on file.      Home Medications    Prior to Admission medications   Medication Sig Start Date End Date Taking? Authorizing  Provider  benzonatate (TESSALON) 200 MG capsule Take 1 capsule (200 mg total) by mouth 3 (three) times daily as needed for up to 7 days for cough. 03/28/20 04/04/20  Thunder Bridgewater C, PA-C  Cetirizine HCl 10 MG CAPS Take 1 capsule (10 mg total) by mouth daily for 10 days. 03/28/20 04/07/20  Naviah Belfield C, PA-C  Norethindrone Acetate-Ethinyl Estrad-FE (LOESTRIN 24 FE) 1-20 MG-MCG(24) tablet Take 1 tablet by mouth daily. 12/05/19   Arvilla Market, DO    Family History Family History  Problem Relation Age of Onset   Allergic rhinitis Maternal Aunt    Angioedema Mother        only lip for 1 year in 2011 with hives   Hypertension Mother    Hyperlipidemia Mother    Obesity Mother    Schizophrenia Maternal Uncle    Diabetes Maternal Grandmother    Hypertension Maternal Grandmother    Asthma Neg Hx    Atopy Neg Hx    Eczema Neg Hx    Immunodeficiency Neg Hx    Urticaria Neg Hx    Thyroid disease Neg Hx     Social History Social History   Tobacco Use   Smoking status: Never Smoker   Smokeless tobacco: Never Used  Vaping Use   Vaping Use: Never used  Substance Use Topics   Alcohol use: No   Drug use: No  Allergies   Cantaloupe (diagnostic), Other, and Scallops [shellfish allergy]   Review of Systems Review of Systems  Constitutional: Negative for activity change, appetite change, chills, fatigue and fever.  HENT: Positive for congestion, postnasal drip and sore throat. Negative for ear pain, rhinorrhea, sinus pressure and trouble swallowing.   Eyes: Negative for discharge and redness.  Respiratory: Positive for cough. Negative for chest tightness and shortness of breath.   Cardiovascular: Negative for chest pain.  Gastrointestinal: Negative for abdominal pain, diarrhea, nausea and vomiting.  Musculoskeletal: Negative for myalgias.  Skin: Negative for rash.  Neurological: Negative for dizziness, light-headedness and headaches.      Physical Exam Triage Vital Signs ED Triage Vitals [03/28/20 1044]  Enc Vitals Group     BP 118/87     Pulse Rate 76     Resp 16     Temp 98.2 F (36.8 C)     Temp Source Oral     SpO2 98 %     Weight 225 lb (102.1 kg)     Height 5\' 4"  (1.626 m)     Head Circumference      Peak Flow      Pain Score 8     Pain Loc      Pain Edu?      Excl. in GC?    No data found.  Updated Vital Signs BP 118/87 (BP Location: Right Arm)    Pulse 76    Temp 98.2 F (36.8 C) (Oral)    Resp 16    Ht 5\' 4"  (1.626 m)    Wt 225 lb (102.1 kg)    SpO2 98%    BMI 38.62 kg/m   Visual Acuity Right Eye Distance:   Left Eye Distance:   Bilateral Distance:    Right Eye Near:   Left Eye Near:    Bilateral Near:     Physical Exam Vitals and nursing note reviewed.  Constitutional:      Appearance: She is well-developed.     Comments: No acute distress  HENT:     Head: Normocephalic and atraumatic.     Ears:     Comments: Bilateral ears without tenderness to palpation of external auricle, tragus and mastoid, EAC's without erythema or swelling, TM's with good bony landmarks and cone of light. Non erythematous.     Nose: Nose normal.     Mouth/Throat:     Comments: Oral mucosa pink and moist, no tonsillar enlargement or exudate. Posterior pharynx patent and nonerythematous, no uvula deviation or swelling. Normal phonation. Eyes:     Conjunctiva/sclera: Conjunctivae normal.  Neck:     Comments: Full active range of motion of neck Cardiovascular:     Rate and Rhythm: Normal rate.  Pulmonary:     Effort: Pulmonary effort is normal. No respiratory distress.     Comments: Breathing comfortably at rest, CTABL, no wheezing, rales or other adventitious sounds auscultated Abdominal:     General: There is no distension.  Musculoskeletal:        General: Normal range of motion.     Cervical back: Neck supple.  Skin:    General: Skin is warm and dry.  Neurological:     Mental Status: She is  alert and oriented to person, place, and time.      UC Treatments / Results  Labs (all labs ordered are listed, but only abnormal results are displayed) Labs Reviewed  CULTURE, GROUP A STREP [facility])  POCT RAPID STREP A (OFFICE)  EKG   Radiology No results found.  Procedures Procedures (including critical care time)  Medications Ordered in UC Medications - No data to display  Initial Impression / Assessment and Plan / UC Course  I have reviewed the triage vital signs and the nursing notes.  Pertinent labs & imaging results that were available during my care of the patient were reviewed by me and considered in my medical decision making (see chart for details).     Strep test negative, suspect likely from postnasal drainage.  Recommending daily antihistamine along with Tessalon for cough, honey and hot tea.  Continue to monitor for resolution, follow-up if not improving or worsening.  If continuing to have dysphagia may need trial of GERD medicine versus further evaluation by GI or ENT.  Discussed strict return precautions. Patient verbalized understanding and is agreeable with plan.  Final Clinical Impressions(s) / UC Diagnoses   Final diagnoses:  Sore throat     Discharge Instructions     Strep test negative Suspect symptoms most likely from postnasal drainage Begin daily cetirizine/Zyrtec, may get over-the-counter if cheaper Tessalon for cough Honey and hot tea with lemon Please follow-up if symptoms not improving or worsening    ED Prescriptions    Medication Sig Dispense Auth. Provider   Cetirizine HCl 10 MG CAPS Take 1 capsule (10 mg total) by mouth daily for 10 days. 10 capsule Barron Vanloan C, PA-C   benzonatate (TESSALON) 200 MG capsule Take 1 capsule (200 mg total) by mouth 3 (three) times daily as needed for up to 7 days for cough. 28 capsule Terelle Dobler, [place] C, PA-C     PDMP not reviewed this encounter.   Lew Dawes, PA-C 03/28/20  1108

## 2020-03-28 NOTE — Discharge Instructions (Signed)
Strep test negative Suspect symptoms most likely from postnasal drainage Begin daily cetirizine/Zyrtec, may get over-the-counter if cheaper Tessalon for cough Honey and hot tea with lemon Please follow-up if symptoms not improving or worsening

## 2020-03-28 NOTE — ED Triage Notes (Signed)
Pt said x 3 days has been having a sore throat. Pain is more deeper in her throat. Pt said hard to swallow and feels like things are  getting stuck in her throat. No redness noted

## 2020-03-30 LAB — CULTURE, GROUP A STREP (THRC)

## 2020-04-12 MED FILL — TARINA 24 FE 1-20 MG-MCG(24: 1-20 | 28 days supply | Qty: 28 | Fill #4

## 2020-05-06 ENCOUNTER — Emergency Department (HOSPITAL_COMMUNITY)
Admission: EM | Admit: 2020-05-06 | Discharge: 2020-05-07 | Disposition: A | Discharge status: Home / Self Care | Payer: No Typology Code available for payment source | Attending: Emergency Medicine | Admitting: Emergency Medicine

## 2020-05-06 ENCOUNTER — Encounter (HOSPITAL_COMMUNITY): Payer: Self-pay

## 2020-05-06 ENCOUNTER — Other Ambulatory Visit: Payer: Self-pay

## 2020-05-06 DIAGNOSIS — R1011 Right upper quadrant pain: Secondary | ICD-10-CM | POA: Diagnosis not present

## 2020-05-06 DIAGNOSIS — K219 Gastro-esophageal reflux disease without esophagitis: Secondary | ICD-10-CM | POA: Insufficient documentation

## 2020-05-06 LAB — CBC
HCT: 41 % (ref 36.0–46.0)
Hemoglobin: 13 g/dL (ref 12.0–15.0)
MCH: 27.3 pg (ref 26.0–34.0)
MCHC: 31.7 g/dL (ref 30.0–36.0)
MCV: 86.1 fL (ref 80.0–100.0)
Platelets: 340 10*3/uL (ref 150–400)
RBC: 4.76 MIL/uL (ref 3.87–5.11)
RDW: 12.3 % (ref 11.5–15.5)
WBC: 8.9 10*3/uL (ref 4.0–10.5)
nRBC: 0 % (ref 0.0–0.2)

## 2020-05-06 LAB — URINALYSIS, ROUTINE W REFLEX MICROSCOPIC
Bilirubin Urine: NEGATIVE
Glucose, UA: NEGATIVE mg/dL
Ketones, ur: NEGATIVE mg/dL
Leukocytes,Ua: NEGATIVE
Nitrite: NEGATIVE
Protein, ur: NEGATIVE mg/dL
Specific Gravity, Urine: 1.028 (ref 1.005–1.030)
pH: 5 (ref 5.0–8.0)

## 2020-05-06 LAB — COMPREHENSIVE METABOLIC PANEL
ALT: 16 U/L (ref 0–44)
AST: 18 U/L (ref 15–41)
Albumin: 3.4 g/dL — ABNORMAL LOW (ref 3.5–5.0)
Alkaline Phosphatase: 51 U/L (ref 38–126)
Anion gap: 9 (ref 5–15)
BUN: 8 mg/dL (ref 6–20)
CO2: 24 mmol/L (ref 22–32)
Calcium: 9.1 mg/dL (ref 8.9–10.3)
Chloride: 106 mmol/L (ref 98–111)
Creatinine, Ser: 0.81 mg/dL (ref 0.44–1.00)
GFR, Estimated: >60 mL/min (ref >60)
Glucose, Bld: 84 mg/dL (ref 70–99)
Potassium: 3.5 mmol/L (ref 3.5–5.1)
Sodium: 139 mmol/L (ref 135–145)
Total Bilirubin: 0.7 mg/dL (ref 0.3–1.2)
Total Protein: 7.2 g/dL (ref 6.5–8.1)

## 2020-05-06 LAB — I-STAT BETA HCG BLOOD, ED (MC, WL, AP ONLY): I-stat hCG, quantitative: <5 m[IU]/mL (ref <5)

## 2020-05-06 LAB — LIPASE, BLOOD: Lipase: 34 U/L (ref 11–51)

## 2020-05-06 MED FILL — TARINA 24 FE 1-20 MG-MCG(24: 1-20 | 28 days supply | Qty: 28 | Fill #5

## 2020-05-06 NOTE — ED Triage Notes (Signed)
Patient complains of RUQ pain intermittently x 3 weeks. Patient states often worse at night when supine. No associated symptoms

## 2020-05-07 ENCOUNTER — Emergency Department (HOSPITAL_COMMUNITY): Payer: No Typology Code available for payment source

## 2020-05-07 ENCOUNTER — Other Ambulatory Visit (HOSPITAL_COMMUNITY): Payer: Self-pay | Admitting: Emergency Medicine

## 2020-05-07 MED ORDER — FAMOTIDINE 20 MG PO TABS
40.0000 mg | ORAL_TABLET | Freq: Once | ORAL | Status: AC
  Administered 2020-05-07: 40 mg via ORAL
  Filled 2020-05-07: qty 2

## 2020-05-07 MED ORDER — FAMOTIDINE 20 MG PO TABS: 20.0000 mg | ORAL_TABLET | Freq: Two times a day (BID) | ORAL | 0 refills | Status: DC

## 2020-05-07 MED FILL — FAMOTIDINE 20 MG TABLET: 20 | 15 days supply | Qty: 30 | Fill #0

## 2020-05-07 NOTE — ED Notes (Signed)
Patient education on bland GI diet and gallbladder protective diet done with patient. She also verbalize understanding of the f/u with surg and PCP. Patient with no distress at this time and ambulatory to the ED exit where her mom is picking her up. No further questions.

## 2020-05-07 NOTE — ED Provider Notes (Signed)
[facility] EMERGENCY DEPARTMENT Provider Note   CSN: [id number] Arrival date & time: 05/06/20  1520     History No chief complaint on file.   Kim Farrell is a 25 y.o. female.  HPI Patient is a 25 year old female with past medical history detailed below presented today for right upper quadrant abdominal pain that has been intermittent for the past 3 weeks.  She states that is worse over the past few days.  Seems a worse when she lies down is sharp and stabbing nonradiating initially but now seems to be radiating to her right shoulder blade.  She has never had an ultrasound of her gallbladder done before she does not have any history of abdominal surgeries.  She denies any vaginal discharge fevers or chills.  Denies any urinary symptoms such as frequency urgency dysuria or hematuria.  No history of kidney stones.  She endorses some epigastric abdominal pain as well that is similar in characteristic.  She denies any changes her bowel movements such as diarrhea or constipation. No other associated symptoms.  No other aggravating or mitigating factors apart from worse with laying flat.      Past Medical History:  Diagnosis Date  . Allergy    pollen, scallops, cantelope,   . Eczema   . 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 Obesity   . PCOS (polycystic ovarian syndrome) 2017   pt self reported as reason for being on metformin  . Pre-diabetes   . Rhabdomyolysis   . Rhabdomyolysis 03/2016  . Rhabdomyolysis     Patient Active Problem List   Diagnosis Date Noted  . Elevated TSH 10/30/2019  . Obesity (BMI 30-39.9) 10/30/2019  . Rhabdomyolysis 10/15/2019  . Elevated transaminase level 10/15/2019  . GERD (gastroesophageal reflux disease) 04/09/2016  . Morbid obesity (HCC) 04/09/2016  . H/O multiple allergies 04/09/2016  . Prediabetes   . Non-traumatic rhabdomyolysis     Past Surgical History:  Procedure Laterality Date  .  ADENOIDECTOMY  2015  . MUSCLE BIOPSY Right 04/14/2016   Procedure: MUSCLE BIOPSY;  Surgeon: Berna Bue, MD;  Location: [facility] OR;  Service: General;  Laterality: Right;  . TONSILLECTOMY       OB History   No obstetric history on file.     Family History  Problem Relation Age of Onset  . Allergic rhinitis Maternal Aunt   . Angioedema Mother        only lip for 1 year in 2011 with hives  . Hypertension Mother   . Hyperlipidemia Mother   . Obesity Mother   . Schizophrenia Maternal Uncle   . Diabetes Maternal Grandmother   . Hypertension Maternal Grandmother   . Asthma Neg Hx   . Atopy Neg Hx   . Eczema Neg Hx   . Immunodeficiency Neg Hx   . Urticaria Neg Hx   . Thyroid disease Neg Hx     Social History   Tobacco Use  . Smoking status: Never Smoker  . Smokeless tobacco: Never Used  Vaping Use  . Vaping Use: Never used  Substance Use Topics  . Alcohol use: No  . Drug use: No    Home Medications Prior to Admission medications   Medication Sig Start Date End Date Taking? Authorizing Provider  famotidine (PEPCID) 20 MG tablet Take 1 tablet (20 mg total) by mouth 2 (two) times daily. 05/07/20  Yes Nico Rogness, Stevphen Meuse S, PA  Norethindrone Acetate-Ethinyl Estrad-FE (LOESTRIN 24 FE) 1-20 MG-MCG(24)  tablet Take 1 tablet by mouth daily. Patient taking differently: Take 1 tablet by mouth at bedtime. 12/05/19  Yes Arvilla Market, DO    Allergies    Cantaloupe (diagnostic), Other, and Scallops [shellfish allergy]  Review of Systems   Review of Systems  Constitutional: Negative for chills and fever.  HENT: Negative for congestion.   Eyes: Negative for pain.  Respiratory: Negative for cough and shortness of breath.   Cardiovascular: Negative for chest pain and leg swelling.  Gastrointestinal: Positive for abdominal pain (RUQ). Negative for diarrhea, nausea and vomiting.  Genitourinary: Negative for dysuria, pelvic pain, vaginal bleeding, vaginal discharge and vaginal  pain.  Musculoskeletal: Negative for myalgias.  Skin: Negative for rash.  Neurological: Negative for dizziness and headaches.    Physical Exam Updated Vital Signs BP 122/84 (BP Location: Left Arm)   Pulse 61   Temp 98 F (36.7 C) (Oral)   Resp 20   SpO2 100%   Physical Exam Vitals and nursing note reviewed.  Constitutional:      General: She is not in acute distress. HENT:     Head: Normocephalic and atraumatic.     Nose: Nose normal.  Eyes:     General: No scleral icterus. Cardiovascular:     Rate and Rhythm: Normal rate and regular rhythm.     Pulses: Normal pulses.     Heart sounds: Normal heart sounds.  Pulmonary:     Effort: Pulmonary effort is normal. No respiratory distress.     Breath sounds: No wheezing.  Abdominal:     Palpations: Abdomen is soft.     Tenderness: There is abdominal tenderness.     Comments: Soft abdomen.  No guarding or rebound.  There is right upper quadrant tenderness with palpation.  No CVA tenderness of the right or left side.  Musculoskeletal:     Cervical back: Normal range of motion.     Right lower leg: No edema.     Left lower leg: No edema.  Skin:    General: Skin is warm and dry.     Capillary Refill: Capillary refill takes less than 2 seconds.  Neurological:     Mental Status: She is alert. Mental status is at baseline.  Psychiatric:        Mood and Affect: Mood normal.        Behavior: Behavior normal.     ED Results / Procedures / Treatments   Labs (all labs ordered are listed, but only abnormal results are displayed) Labs Reviewed  COMPREHENSIVE METABOLIC PANEL - Abnormal; Notable for the following components:      Result Value   Albumin 3.4 (*)    All other components within normal limits  URINALYSIS, ROUTINE W REFLEX MICROSCOPIC - Abnormal; Notable for the following components:   APPearance HAZY (*)    Hgb urine dipstick MODERATE (*)    Bacteria, UA RARE (*)    All other components within normal limits  LIPASE,  BLOOD  CBC  I-STAT BETA HCG BLOOD, ED ([facility], [facility], [facility] ONLY)    EKG None  Radiology No results found.  CLINICAL DATA: Right upper quadrant pain    EXAM:  ULTRASOUND ABDOMEN LIMITED RIGHT UPPER QUADRANT    COMPARISON: None.    FINDINGS:  Gallbladder:    Contracted. Sludge is present. No gallstones or wall thickening  visualized. No sonographic Murphy sign noted by sonographer.    Common bile duct:    Diameter: 3 mm, normal    Liver:  No focal lesion identified. Increased parenchymal echogenicity.  Portal vein is patent on color Doppler imaging with normal direction  of blood flow towards the liver.    Other: None.    IMPRESSION:  Contracted gallbladder with some sludge. No sonographic evidence of  acute cholecystitis.    Increased liver echogenicity likely reflects steatosis.      Electronically Signed  By: Guadlupe Spanish M.D.  On: 05/07/2020 10:38     Procedures Procedures (including critical care time)  Medications Ordered in ED Medications  famotidine (PEPCID) tablet 40 mg (40 mg Oral Given 05/07/20 1050)    ED Course  I have reviewed the triage vital signs and the nursing notes.  Pertinent labs & imaging results that were available during my care of the patient were reviewed by me and considered in my medical decision making (see chart for details).    MDM Rules/Calculators/A&P                          Patient is 25 year old female with medical brought abdominal pain.  Physical exam is notable for some tenderness in right upper quadrant and epigastrium.  I-STAT Hg negative for pregnancy lipase within normal limits at pancreatitis CBC within normal months without leukocytosis or anemia.  CMP without any significant abnormalities.  UA does have repeat urine moderate hemoglobin she states she may have had some spotting.  Low suspicion for UTI or pyelonephritis.  I have low suspicion for kidney stone as well.  Also low suspicion for  [employer].  Most likely related to gallbladder pathology may be stone or sludge.  Ultrasounds shows below findings.  No evidence of cholecystitis.  She does not have a leukocytosis I have low suspicion for cholecystitis.  She is given strict return precautions. IMPRESSION:  Contracted gallbladder with some sludge. No sonographic evidence of  acute cholecystitis.    Increased liver echogenicity likely reflects steatosis.   Also discussed case my attending physician.  Will discharge with Pepcid and recommendation to follow-up with general surgery and information for their clinic.  Patient feels significantly proved at this time.  She has only received Pepcid will discharge with Pepcid.  She understands return precautions for symptoms of cholangitis, cholecystitis.  She has no further questions at this time.  Final Clinical Impression(s) / ED Diagnoses Final diagnoses:  RUQ abdominal pain    Rx / DC Orders ED Discharge Orders         Ordered    famotidine (PEPCID) 20 MG tablet  2 times daily        05/07/20 1046           Solon Augusta [place], [place] 05/09/20 1508    Lorre Nick, MD 05/10/20 [phone]

## 2020-05-07 NOTE — Discharge Instructions (Addendum)
Your work-up today was reassuring.  Your ultrasound showed some sludge in her gallbladder with no obvious stones or evidence of infection of the gallbladder.  Please follow the dietary recommendations and the printout attached to the back of this packet. Is important to avoid overly fatty foods, red meats and also avoid eating right before you lay down to go to bed.  Please follow-up with your primary care doctor.  I have also provided you with information for our general surgery team you may follow-up with them to discuss further options of care.

## 2021-02-10 IMAGING — MR MR FEMUR*R* WO/W CM
31 series · 40 of 40 positions shown · IV contrast (gadavist)
Comparison: None.

CLINICAL DATA: Right lower extremity rhabdomyolysis

EXAM:
MRI OF THE RIGHT FEMUR WITHOUT AND WITH CONTRAST
TECHNIQUE: Multiplanar, multisequence MR imaging of the right femur was
performed both before and after administration of intravenous
contrast.
CONTRAST:  10mL GADAVIST GADOBUTROL 1 MMOL/ML IV SOLN

[Series 2: STIR · coronal · right · 5.0mm · 1.25mm/px · 2 of 40 slices shown (1 of 2)]
[im 1/40]
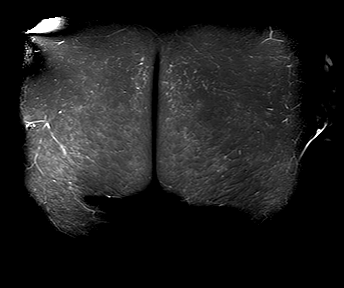
[im 40/40]
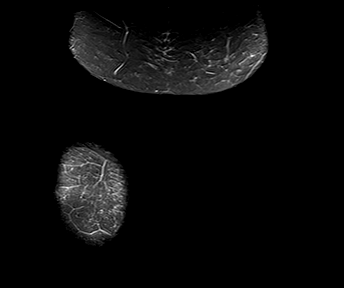

[Series 3: STIR · coronal · right · 5.0mm · 1.25mm/px · 2 of 40 slices shown (2 of 2)]
[im 1/40]
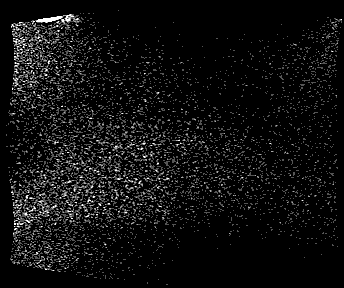
[im 40/40]
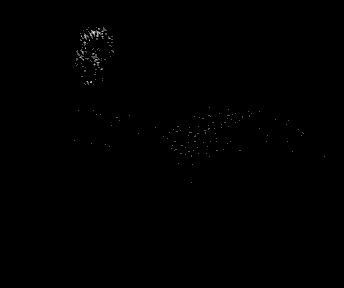

[Series 4: composed cor stir_comp · coronal · right · 6.0mm · 1.25mm/px · 2 of 40 slices shown]
[im 1/40]
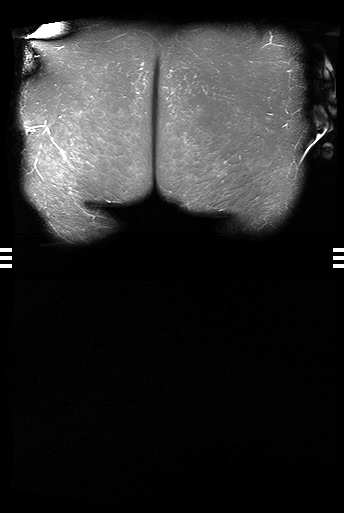
[im 40/40]
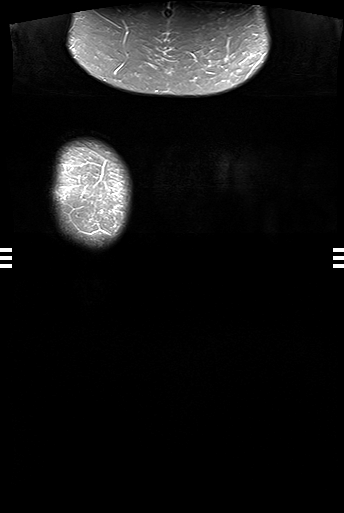

[Series 6: T1 · coronal · right · 5.0mm · 1.25mm/px · 2 of 40 slices shown (1 of 4)]
[im 1/40]
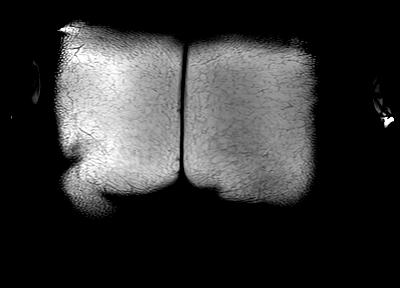
[im 40/40]
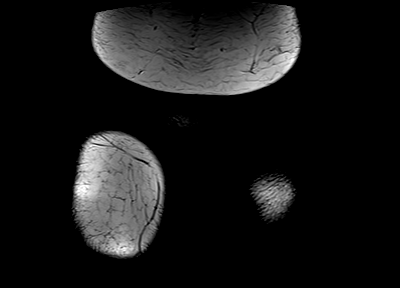

[Series 9: composed cor t1_comp_filt · coronal · right · 6.0mm · 1.25mm/px · 2 of 40 slices shown (1 of 2)]
[im 1/40]
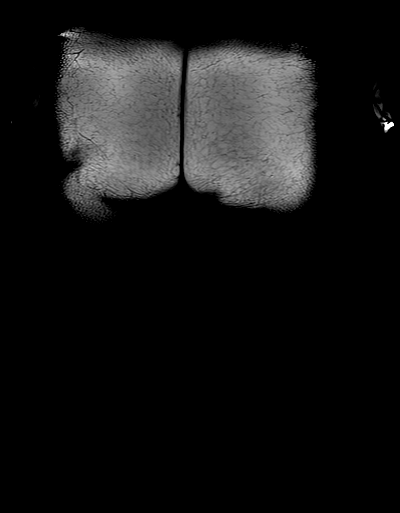
[im 40/40]
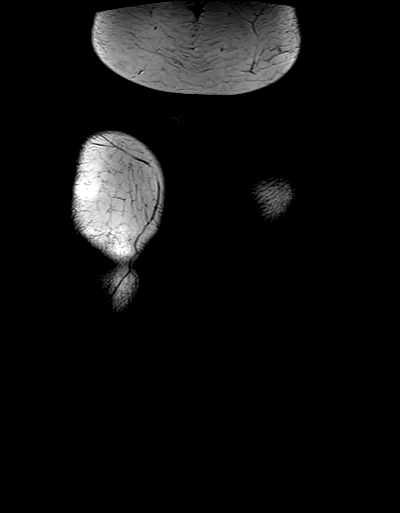

[Series 10: T1 · axial · right · 5.0mm · 1.09mm/px · 1 of 48 slices shown (2 of 4)]
[im 1/48]
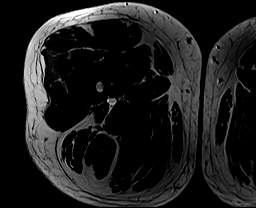

[Series 11: T1 · axial · right · 5.0mm · 1.02mm/px · 1 of 48 slices shown (3 of 4)]
[im 1/48]
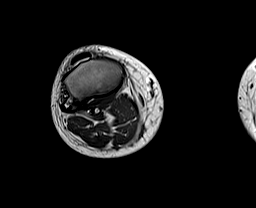

[Series 12: ax t1_comp · axial · right · 5.0mm · 1.02mm/px · z∈[-281,+268]mm · 2 of 92 slices shown]
[im 1/92]
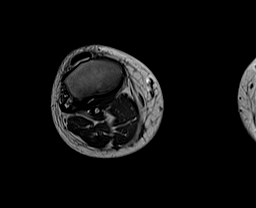
[im 92/92]
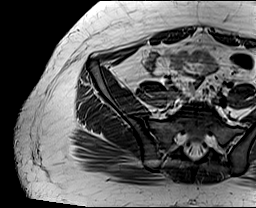

[Series 13: T2 fat-sat · axial · right · 5.0mm · 1.35mm/px · 1 of 48 slices shown (1 of 6)]
[im 1/48]
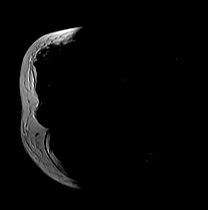

[Series 14: T2 fat-sat · axial · right · 5.0mm · 1.35mm/px · 1 of 48 slices shown (2 of 6)]
[im 1/48]
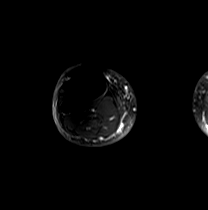

[Series 15: T2 · axial · right · 5.0mm · 1.35mm/px · z∈[-283,+268]mm · 2 of 92 slices shown (1 of 4)]
[im 1/92]
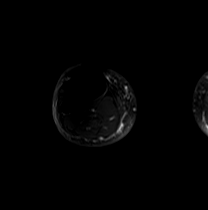
[im 92/92]
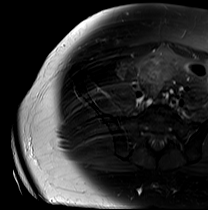

[Series 16: T2 fat-sat · sagittal · right · 4.0mm · 1.46mm/px · 1 of 44 slices shown (3 of 6)]
[im 1/44]
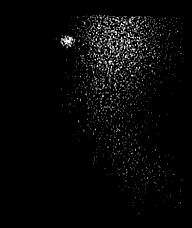

[Series 17: T2 fat-sat · sagittal · right · 4.0mm · 1.46mm/px · 1 of 44 slices shown (4 of 6)]
[im 1/44]
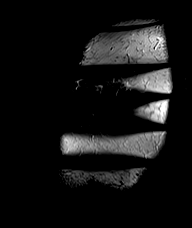

[Series 18: T2 · sagittal · right · 5.0mm · 1.46mm/px · 1 of 44 slices shown (2 of 4)]
[im 1/44]
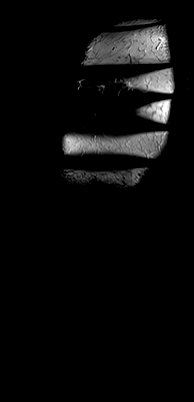

[Series 19: T2 fat-sat · axial · right · 5.0mm · 1.20mm/px · 1 of 40 slices shown (5 of 6)]
[im 1/40]
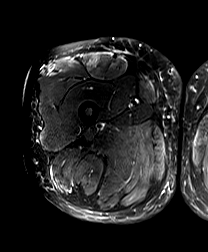

[Series 20: T2 fat-sat · axial · right · 5.0mm · 1.15mm/px · 1 of 40 slices shown (6 of 6)]
[im 1/40]
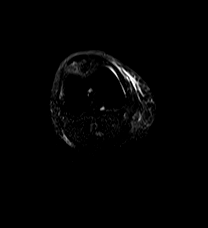

[Series 21: T2 · axial · right · 5.0mm · 1.15mm/px · 1 of 39 slices shown (3 of 4)]
[im 1/39]
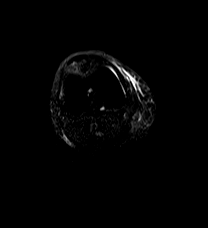

[Series 21: T2 · axial · right · 5.0mm · 1.20mm/px · 1 of 39 slices shown (4 of 4)]
[im 1/39]
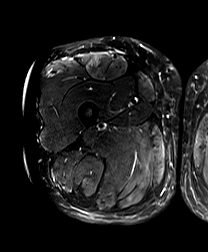

[Series 22: T1 fat-sat · axial · non-contrast · right · 5.0mm · 1.09mm/px · 1 of 48 slices shown (1 of 3)]
[im 1/48]
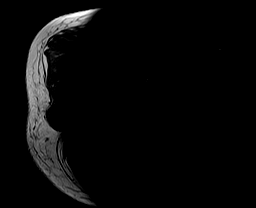

[Series 23: T1 fat-sat · axial · non-contrast · right · 5.0mm · 1.09mm/px · 1 of 48 slices shown (2 of 3)]
[im 1/48]
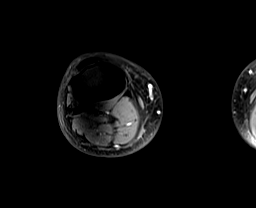

[Series 24: T1 · axial · non-contrast · right · 5.0mm · 1.09mm/px · z∈[-281,+268]mm · 2 of 92 slices shown (4 of 4)]
[im 1/92]
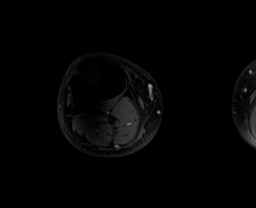
[im 92/92]
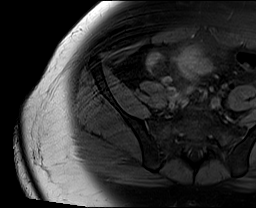

[Series 25: T1 fat-sat post-contrast · axial · right · 5.0mm · 1.09mm/px · 1 of 48 slices shown (1 of 6)]
[im 1/48]
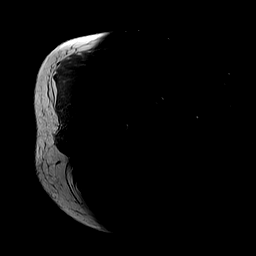

[Series 26: T1 fat-sat post-contrast · axial · right · 5.0mm · 1.09mm/px · 1 of 48 slices shown (2 of 6)]
[im 1/48]
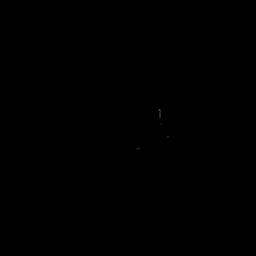

[Series 27: T1 post-contrast · axial · right · 5.0mm · 1.09mm/px · z∈[-281,+268]mm · 2 of 92 slices shown]
[im 1/92]
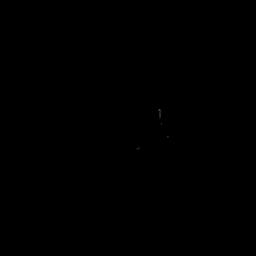
[im 92/92]
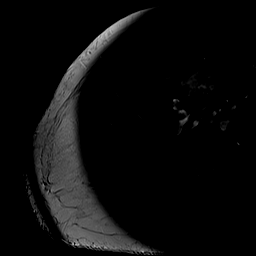

[Series 28: T1 fat-sat post-contrast · coronal · right · 5.0mm · 1.25mm/px · 1 of 40 slices shown (3 of 6)]
[im 1/40]
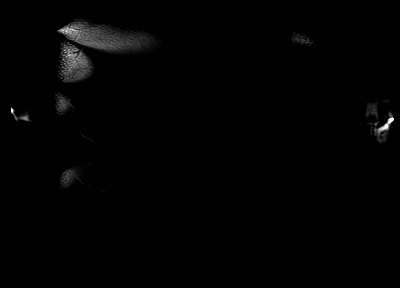

[Series 29: T1 fat-sat post-contrast · coronal · right · 5.0mm · 1.25mm/px · 1 of 40 slices shown (4 of 6)]
[im 1/40]
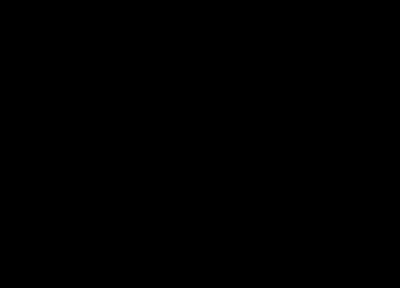

[Series 30: composed cor t1_comp · coronal · right · 6.0mm · 1.25mm/px · 1 of 40 slices shown]
[im 1/40]
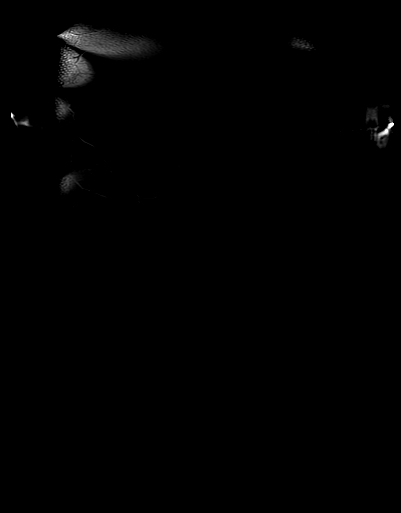

[Series 31: composed cor t1_comp_filt · coronal · right · 6.0mm · 1.25mm/px · 1 of 40 slices shown (2 of 2)]
[im 1/40]
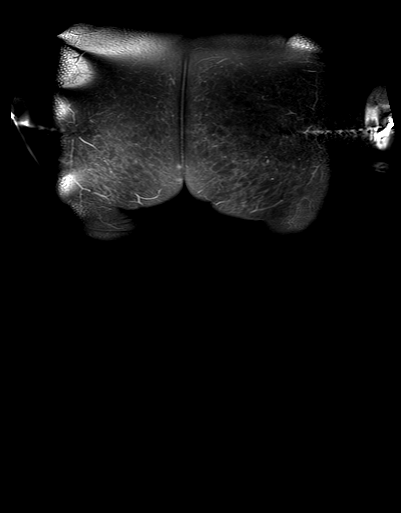

[Series 32: T1 fat-sat post-contrast · sagittal · right · 4.0mm · 1.02mm/px · 1 of 40 slices shown (5 of 6)]
[im 1/40]
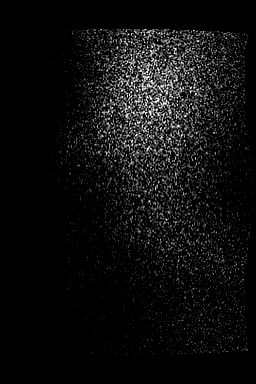

[Series 33: T1 fat-sat post-contrast · sagittal · right · 4.0mm · 1.05mm/px · 1 of 41 slices shown (6 of 6)]
[im 1/41]
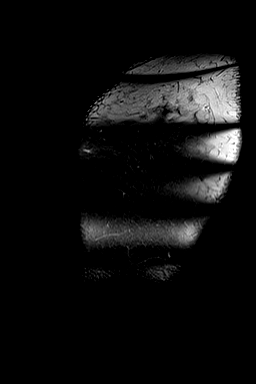

[Series 34: T1 fat-sat · sagittal · right · 5.0mm · 1.02mm/px · 1 of 41 slices shown (3 of 3)]
[im 1/41]
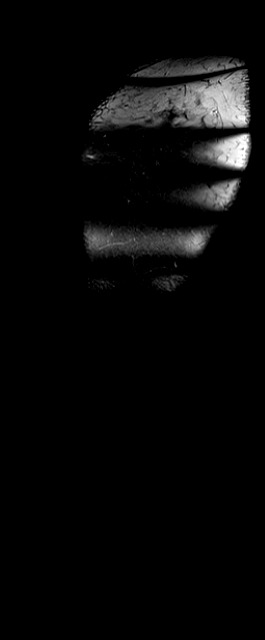

[40 of 40 positions shown; findings below may reference images not displayed]

FINDINGS: Bones/Joint/Cartilage

No acute fracture. No dislocation. No femoral head avascular
necrosis. No bone marrow edema. No suspicious bone lesion. No
significant arthropathy of the right hip or knee. No hip or knee
joint effusion.

Ligaments

Intact.

Muscles and Tendons

There is T2 hyperintense edema like signal throughout the right
thigh musculature most predominantly involving the vastus lateralis,
rectus femoris, vastus medialis, and distal biceps femoris muscles
(series 15, image 59). Fluid signal is most prevalent within the
distal vastus lateralis muscle belly (series 15, image 67) without a
well-defined intramuscular fluid collection. There is geographic
postcontrast enhancement of the involved portions of the affected
muscles. No rim enhancement. No focal areas of non enhancement to
suggest myonecrosis.

There are similar findings noted to the contralateral thigh
musculature seen on full field imaging (series 4, image 32).

Soft tissues

Mild subcutaneous edema of the visualized bilateral lower
extremities. No soft tissue fluid collection. No inguinal
lymphadenopathy. Small amount of free fluid within the pelvis,
nonspecific and may be physiologic.
IMPRESSION: 1. Diffuse intramuscular edema throughout the right thigh
musculature most predominantly involving the vastus lateralis,
rectus femoris, vastus medialis, and distal biceps femoris muscles
with geographic areas of muscle enhancement. Findings are most
compatible with the provided history of rhabdomyolysis. No specific
MRI evidence to suggest myonecrosis.
2. Similar findings are visualized on included images through the
left thigh.

## 2022-06-01 ENCOUNTER — Ambulatory Visit
Admission: EM | Admit: 2022-06-01 | Discharge: 2022-06-01 | Disposition: A | Discharge status: Home / Self Care | Payer: No Typology Code available for payment source | Attending: Family Medicine | Admitting: Family Medicine

## 2022-06-01 DIAGNOSIS — Z202 Contact with and (suspected) exposure to infections with a predominantly sexual mode of transmission: Secondary | ICD-10-CM

## 2022-06-01 LAB — POCT URINALYSIS DIP (MANUAL ENTRY)
Bilirubin, UA: NEGATIVE
Blood, UA: NEGATIVE
Glucose, UA: NEGATIVE mg/dL
Ketones, POC UA: NEGATIVE mg/dL
Leukocytes, UA: NEGATIVE
Nitrite, UA: NEGATIVE
Protein Ur, POC: NEGATIVE mg/dL
Spec Grav, UA: 1.025 (ref 1.010–1.025)
Urobilinogen, UA: 0.2 E.U./dL
pH, UA: 7 (ref 5.0–8.0)

## 2022-06-01 NOTE — ED Provider Notes (Signed)
[facility]    CSN: [id number] Arrival date & time: 06/01/22  1752      History   Chief Complaint Chief Complaint  Patient presents with   SEXUALLY TRANSMITTED DISEASE    HPI Kim Farrell is a 27 y.o. female.   HPI Very pleasant 27 year old female presents with possible STD.  Patient denies any current symptoms.  Patient reports that her boyfriend informed her earlier today that he has noticed penile discharge.  Patient request STD testing this evening.  PMH significant for obesity, rhabdomyolysis, and PCOS.  Patient denies history of STD.  Past Medical History:  Diagnosis Date   Allergy    pollen, scallops, cantelope,    Eczema    Neuromuscular disorder ([place])    Phreesia 12/03/2019   Obesity    PCOS (polycystic ovarian syndrome) 2017   pt self reported as reason for being on metformin   Pre-diabetes    Rhabdomyolysis    Rhabdomyolysis 03/2016   Rhabdomyolysis     Patient Active Problem List   Diagnosis Date Noted   Elevated TSH 10/30/2019   Obesity (BMI 30-39.9) 10/30/2019   Rhabdomyolysis 10/15/2019   Elevated transaminase level 10/15/2019   GERD (gastroesophageal reflux disease) 04/09/2016   Morbid obesity (Uncertain) 04/09/2016   H/O multiple allergies 04/09/2016   Prediabetes    Non-traumatic rhabdomyolysis     Past Surgical History:  Procedure Laterality Date   ADENOIDECTOMY  2015   MUSCLE BIOPSY Right 04/14/2016   Procedure: MUSCLE BIOPSY;  Surgeon: Clovis Riley, MD;  Location: [place];  Service: General;  Laterality: Right;   TONSILLECTOMY      OB History   No obstetric history on file.      Home Medications    Prior to Admission medications   Not on File    Family History Family History  Problem Relation Age of Onset   Allergic rhinitis Maternal Aunt    Angioedema Mother        only lip for 1 year in 2011 with hives   Hypertension Mother    Hyperlipidemia Mother    Obesity Mother    Schizophrenia Maternal Uncle     Diabetes Maternal Grandmother    Hypertension Maternal Grandmother    Asthma Neg Hx    Atopy Neg Hx    Eczema Neg Hx    Immunodeficiency Neg Hx    Urticaria Neg Hx    Thyroid disease Neg Hx     Social History Social History   Tobacco Use   Smoking status: Never   Smokeless tobacco: Never  Vaping Use   Vaping Use: Never used  Substance Use Topics   Alcohol use: No   Drug use: No     Allergies   Cantaloupe (diagnostic), Other, and Scallops [shellfish allergy]   Review of Systems Review of Systems  Constitutional:        Patient is concerned with potential STD and request testing this evening.  Patient denies any current symptoms.  All other systems reviewed and are negative.    Physical Exam Triage Vital Signs ED Triage Vitals  Enc Vitals Group     BP      Pulse      Resp      Temp      Temp src      SpO2      Weight      Height      Head Circumference      Peak Flow  Pain Score      Pain Loc      Pain Edu?      Excl. in [place]?    No data found.  Updated Vital Signs BP 135/74 (BP Location: Right Arm)   Pulse (!) 58   Temp 98.2 F (36.8 C) (Oral)   Resp 18   LMP 04/19/2022 (Exact Date)   SpO2 97%    Physical Exam Vitals and nursing note reviewed.  Constitutional:      Appearance: Normal appearance. She is obese. She is not ill-appearing.  HENT:     Head: Normocephalic and atraumatic.     Mouth/Throat:     Mouth: Mucous membranes are moist.     Pharynx: Oropharynx is clear.  Eyes:     Extraocular Movements: Extraocular movements intact.     Conjunctiva/sclera: Conjunctivae normal.     Pupils: Pupils are equal, round, and reactive to light.  Cardiovascular:     Rate and Rhythm: Normal rate and regular rhythm.     Pulses: Normal pulses.     Heart sounds: Normal heart sounds.  Pulmonary:     Effort: Pulmonary effort is normal.     Breath sounds: Normal breath sounds. No wheezing, rhonchi or rales.  Abdominal:     Tenderness: There is  no right CVA tenderness or left CVA tenderness.  Musculoskeletal:        General: Normal range of motion.     Cervical back: Normal range of motion and neck supple.  Skin:    General: Skin is warm and dry.  Neurological:     General: No focal deficit present.     Mental Status: She is alert and oriented to person, place, and time. Mental status is at baseline.      UC Treatments / Results  Labs (all labs ordered are listed, but only abnormal results are displayed) Labs Reviewed  POCT URINALYSIS DIP (MANUAL ENTRY)  CERVICOVAGINAL ANCILLARY ONLY    EKG   Radiology No results found.  Procedures Procedures (including critical care time)  Medications Ordered in UC Medications - No data to display  Initial Impression / Assessment and Plan / UC Course  I have reviewed the triage vital signs and the nursing notes.  Pertinent labs & imaging results that were available during my care of the patient were reviewed by me and considered in my medical decision making (see chart for details).     MDM: 1.  Potential exposure to STD-UA, urinalysis was within normal limits today no urine culture necessary.  Aptima swab ordered. Advised patient we will follow-up with Aptima swab results once received.  Patient discharged home, hemodynamically stable.   Final Clinical Impressions(s) / UC Diagnoses   Final diagnoses:  Potential exposure to STD     Discharge Instructions      Advised patient we will follow-up with Aptima swab results once received.     ED Prescriptions   None    PDMP not reviewed this encounter.   Eliezer Lofts, FNP 06/01/22 [place], [place], [place] 06/02/22 1538

## 2022-06-01 NOTE — ED Triage Notes (Signed)
Patient states her boyfriend told her something "wasn't right down there" with his genitals and noticed discharge. Patient just wants to get checked to make sure she has no STI.

## 2022-06-01 NOTE — Discharge Instructions (Addendum)
Advised patient we will follow-up with Aptima swab results once received.

## 2022-06-02 ENCOUNTER — Telehealth: Payer: Self-pay

## 2022-06-02 ENCOUNTER — Other Ambulatory Visit (HOSPITAL_COMMUNITY): Payer: Self-pay

## 2022-06-02 ENCOUNTER — Ambulatory Visit
Admission: EM | Admit: 2022-06-02 | Discharge: 2022-06-02 | Disposition: A | Discharge status: Home / Self Care | Payer: Self-pay | Attending: Family Medicine | Admitting: Family Medicine

## 2022-06-02 ENCOUNTER — Telehealth: Payer: Self-pay | Admitting: Family Medicine

## 2022-06-02 DIAGNOSIS — A64 Unspecified sexually transmitted disease: Secondary | ICD-10-CM

## 2022-06-02 LAB — CERVICOVAGINAL ANCILLARY ONLY
Chlamydia: POSITIVE — AB
Comment: NEGATIVE
Comment: NEGATIVE
Comment: NORMAL
Neisseria Gonorrhea: POSITIVE — AB
Trichomonas: NEGATIVE

## 2022-06-02 MED ORDER — DOXYCYCLINE HYCLATE 100 MG PO CAPS: 100.0000 mg | ORAL_CAPSULE | Freq: Two times a day (BID) | ORAL | 0 refills | Status: AC

## 2022-06-02 MED ORDER — DOXYCYCLINE HYCLATE 100 MG PO CAPS
100.0000 mg | ORAL_CAPSULE | Freq: Two times a day (BID) | ORAL | 0 refills | Status: DC
Start: 2022-06-02 — End: 2022-06-02
  Filled 2022-06-02: qty 14, 7d supply, fill #0

## 2022-06-02 MED ORDER — CEFTRIAXONE SODIUM 1 G IJ SOLR
0.5000 g | Freq: Once | INTRAMUSCULAR | Status: AC
  Administered 2022-06-02: 0.5 g via INTRAMUSCULAR

## 2022-06-02 NOTE — ED Triage Notes (Signed)
Nurse visit for rocephin shot for treatment.

## 2022-06-02 NOTE — Telephone Encounter (Addendum)
Patient left VM by [facility] staff (patient positive GC chlamydia and was evaluated by me yesterday, 06/01/22.  Patient advised please return to clinic for IM Rocephin injection.  Advised doxycycline sent to previously requested pharmacy.

## 2022-06-13 ENCOUNTER — Other Ambulatory Visit (HOSPITAL_COMMUNITY): Payer: Self-pay

## 2023-09-21 ENCOUNTER — Ambulatory Visit: Admitting: Family

## 2024-01-19 ENCOUNTER — Ambulatory Visit
Admission: EM | Admit: 2024-01-19 | Discharge: 2024-01-19 | Disposition: A | Discharge status: Home / Self Care | Attending: Family Medicine | Admitting: Family Medicine

## 2024-01-19 DIAGNOSIS — Z113 Encounter for screening for infections with a predominantly sexual mode of transmission: Secondary | ICD-10-CM | POA: Insufficient documentation

## 2024-01-19 DIAGNOSIS — N926 Irregular menstruation, unspecified: Secondary | ICD-10-CM | POA: Diagnosis not present

## 2024-01-19 DIAGNOSIS — N898 Other specified noninflammatory disorders of vagina: Secondary | ICD-10-CM | POA: Insufficient documentation

## 2024-01-19 LAB — POCT URINE PREGNANCY: Preg Test, Ur: NEGATIVE

## 2024-01-19 MED ORDER — METRONIDAZOLE 500 MG PO TABS: 500.0000 mg | ORAL_TABLET | Freq: Two times a day (BID) | ORAL | 0 refills | Status: AC

## 2024-01-19 NOTE — ED Provider Notes (Signed)
 [facility]    CSN: [id number] Arrival date & time: 01/19/24  1608      History   Chief Complaint Chief Complaint  Patient presents with   SEXUALLY TRANSMITTED DISEASE    HPI Kim Farrell is a 28 y.o. female presents for STD testing and vaginal odor.  Patient reports she is a and states history of frequent BV infections she has noticed anabnormal odor which she has had before when she has had BV.  She is not sure how long this has been going on for.  Denies vaginal discharge or dysuria.  No fevers vomiting or back pain.  No known STD exposure but where she would like screening.  Reports she has PCOS and has irregular periods, last 1 was in May 2025.  No other concerns at this time  HPI  Past Medical History:  Diagnosis Date   Allergy     pollen, scallops, cantelope,    Eczema    Neuromuscular disorder (HCC)    Phreesia 12/03/2019   Obesity    PCOS (polycystic ovarian syndrome) 2017   pt self reported as reason for being on metformin    Pre-diabetes    Rhabdomyolysis    Rhabdomyolysis 03/2016   Rhabdomyolysis     Patient Active Problem List   Diagnosis Date Noted   Elevated TSH 10/30/2019   Obesity (BMI 30-39.9) 10/30/2019   Rhabdomyolysis 10/15/2019   Elevated transaminase level 10/15/2019   GERD (gastroesophageal reflux disease) 04/09/2016   Morbid obesity (HCC) 04/09/2016   H/O multiple allergies 04/09/2016   Prediabetes    Non-traumatic rhabdomyolysis     Past Surgical History:  Procedure Laterality Date   ADENOIDECTOMY  2015   MUSCLE BIOPSY Right 04/14/2016   Procedure: MUSCLE BIOPSY;  Surgeon: Mitzie DELENA Freund, MD;  Location: [facility] OR;  Service: General;  Laterality: Right;   TONSILLECTOMY      OB History   No obstetric history on file.      Home Medications    Prior to Admission medications   Medication Sig Start Date End Date Taking? Authorizing Provider  metroNIDAZOLE  (FLAGYL ) 500 MG tablet Take 1 tablet (500 mg total) by  mouth 2 (two) times daily. 01/19/24  Yes Loreda Myla SAUNDERS, NP    Family History Family History  Problem Relation Age of Onset   Allergic rhinitis Maternal Aunt    Angioedema Mother        only lip for 1 year in 2011 with hives   Hypertension Mother    Hyperlipidemia Mother    Obesity Mother    Schizophrenia Maternal Uncle    Diabetes Maternal Grandmother    Hypertension Maternal Grandmother    Asthma Neg Hx    Atopy Neg Hx    Eczema Neg Hx    Immunodeficiency Neg Hx    Urticaria Neg Hx    Thyroid  disease Neg Hx     Social History Social History   Tobacco Use   Smoking status: Never   Smokeless tobacco: Never  Vaping Use   Vaping status: Never Used  Substance Use Topics   Alcohol use: No   Drug use: No     Allergies   Cantaloupe (diagnostic), Other, and Scallops [shellfish allergy ]   Review of Systems Review of Systems  Genitourinary:        STD testing and vaginal odor     Physical Exam Triage Vital Signs ED Triage Vitals  Encounter Vitals Group     BP 01/19/24 1628 121/85  Girls Systolic BP Percentile --      Girls Diastolic BP Percentile --      Boys Systolic BP Percentile --      Boys Diastolic BP Percentile --      Pulse Rate 01/19/24 1628 69     Resp 01/19/24 1628 20     Temp 01/19/24 1628 98.6 F (37 C)     Temp src --      SpO2 01/19/24 1628 97 %     Weight --      Height --      Head Circumference --      Peak Flow --      Pain Score 01/19/24 1627 0     Pain Loc --      Pain Education --      Exclude from Growth Chart --    No data found.  Updated Vital Signs BP 121/85 (BP Location: Right Arm)   Pulse 69   Temp 98.6 F (37 C)   Resp 20   LMP 08/26/2023 (Exact Date) Comment: States she has PCOS  SpO2 97%   Visual Acuity Right Eye Distance:   Left Eye Distance:   Bilateral Distance:    Right Eye Near:   Left Eye Near:    Bilateral Near:     Physical Exam Vitals and nursing note reviewed.  Constitutional:      General:  She is not in acute distress.    Appearance: Normal appearance. She is not ill-appearing.  HENT:     Head: Normocephalic and atraumatic.  Eyes:     Pupils: Pupils are equal, round, and reactive to light.  Cardiovascular:     Rate and Rhythm: Normal rate.  Pulmonary:     Effort: Pulmonary effort is normal.  Skin:    General: Skin is warm and dry.  Neurological:     General: No focal deficit present.     Mental Status: She is alert and oriented to person, place, and time.  Psychiatric:        Mood and Affect: Mood normal.        Behavior: Behavior normal.      UC Treatments / Results  Labs (all labs ordered are listed, but only abnormal results are displayed) Labs Reviewed  RPR  HIV ANTIBODY (ROUTINE TESTING W REFLEX)  POCT URINE PREGNANCY  CERVICOVAGINAL ANCILLARY ONLY    EKG   Radiology No results found.  Procedures Procedures (including critical care time)  Medications Ordered in UC Medications - No data to display  Initial Impression / Assessment and Plan / UC Course  I have reviewed the triage vital signs and the nursing notes.  Pertinent labs & imaging results that were available during my care of the patient were reviewed by me and considered in my medical decision making (see chart for details).     Reviewed exam and symptoms with patient.  No red flags. Neg urine hcg.  STD testing/vaginal swab is ordered and will contact for any positive results.  As patient reports symptoms are consistent with previous BV infections will start metronidazole  twice daily for 7 days.  Advised PCP or GYN follow-up if symptoms do not improve.  ER precautions reviewed Final Clinical Impressions(s) / UC Diagnoses   Final diagnoses:  Irregular periods  Screening examination for STD (sexually transmitted disease)  Vaginal odor     Discharge Instructions      The clinic will contact you with results of the STD testing/vaginal swab that  was done today if positive.   Metronidazole  twice daily for 7 days for your BV symptoms.  Lots of rest fluids and please follow-up with your PCP or gynecologist if your symptoms do not improve.  Please go to the ER for any worsening symptoms.  Hope you feel better soon!     ED Prescriptions     Medication Sig Dispense Auth. Provider   metroNIDAZOLE  (FLAGYL ) 500 MG tablet Take 1 tablet (500 mg total) by mouth 2 (two) times daily. 14 tablet Alysia Scism, Jodi R, NP      PDMP not reviewed this encounter.   Loreda Myla SAUNDERS, NP 01/19/24 [phone]

## 2024-01-19 NOTE — Discharge Instructions (Signed)
 The clinic will contact you with results of the STD testing/vaginal swab that was done today if positive.  Metronidazole  twice daily for 7 days for your BV symptoms.  Lots of rest fluids and please follow-up with your PCP or gynecologist if your symptoms do not improve.  Please go to the ER for any worsening symptoms.  Hope you feel better soon!

## 2024-01-19 NOTE — ED Triage Notes (Signed)
 Pt present for STD testing. States she consistently has BV and is now wondering if she has an STD.

## 2024-01-20 LAB — CERVICOVAGINAL ANCILLARY ONLY
Bacterial Vaginitis (gardnerella): NEGATIVE
Candida Glabrata: NEGATIVE
Candida Vaginitis: NEGATIVE
Chlamydia: NEGATIVE
Comment: NEGATIVE
Comment: NEGATIVE
Comment: NEGATIVE
Comment: NEGATIVE
Comment: NEGATIVE
Comment: NORMAL
Neisseria Gonorrhea: NEGATIVE
Trichomonas: NEGATIVE

## 2024-01-20 LAB — HIV ANTIBODY (ROUTINE TESTING W REFLEX): HIV Screen 4th Generation wRfx: NONREACTIVE

## 2024-01-20 LAB — RPR: RPR Ser Ql: NONREACTIVE
